# Patient Record
Sex: Female | Born: 1970 | Race: White | Hispanic: No | Marital: Married | Smoking: Never smoker
Health system: Southern US, Community
[De-identification: ages and names within clinical notes are randomized; demographics above are authoritative.]

## PROBLEM LIST (undated history)

## (undated) DIAGNOSIS — G8929 Other chronic pain: Secondary | ICD-10-CM

## (undated) DIAGNOSIS — Z1211 Encounter for screening for malignant neoplasm of colon: Secondary | ICD-10-CM

## (undated) DIAGNOSIS — M47816 Spondylosis without myelopathy or radiculopathy, lumbar region: Secondary | ICD-10-CM

## (undated) DIAGNOSIS — M5416 Radiculopathy, lumbar region: Secondary | ICD-10-CM

## (undated) DIAGNOSIS — G43909 Migraine, unspecified, not intractable, without status migrainosus: Secondary | ICD-10-CM

## (undated) DIAGNOSIS — K219 Gastro-esophageal reflux disease without esophagitis: Secondary | ICD-10-CM

## (undated) DIAGNOSIS — J302 Other seasonal allergic rhinitis: Secondary | ICD-10-CM

## (undated) DIAGNOSIS — G4700 Insomnia, unspecified: Secondary | ICD-10-CM

## (undated) DIAGNOSIS — D219 Benign neoplasm of connective and other soft tissue, unspecified: Secondary | ICD-10-CM

## (undated) DIAGNOSIS — M47812 Spondylosis without myelopathy or radiculopathy, cervical region: Secondary | ICD-10-CM

## (undated) DIAGNOSIS — R51 Headache: Secondary | ICD-10-CM

## (undated) DIAGNOSIS — M51369 Other intervertebral disc degeneration, lumbar region without mention of lumbar back pain or lower extremity pain: Secondary | ICD-10-CM

## (undated) DIAGNOSIS — E785 Hyperlipidemia, unspecified: Secondary | ICD-10-CM

## (undated) DIAGNOSIS — J45909 Unspecified asthma, uncomplicated: Secondary | ICD-10-CM

## (undated) DIAGNOSIS — M25551 Pain in right hip: Secondary | ICD-10-CM

## (undated) DIAGNOSIS — G43019 Migraine without aura, intractable, without status migrainosus: Principal | ICD-10-CM

## (undated) HISTORY — DX: Benign neoplasm of connective and other soft tissue, unspecified: D21.9

## (undated) HISTORY — DX: Headache: R51

## (undated) HISTORY — DX: Encounter for screening for malignant neoplasm of colon: Z12.11

## (undated) HISTORY — PX: TONSILLECTOMY: SUR1361

## (undated) HISTORY — DX: Insomnia, unspecified: G47.00

## (undated) HISTORY — DX: Migraine, unspecified, not intractable, without status migrainosus: G43.909

## (undated) HISTORY — DX: Hyperlipidemia, unspecified: E78.5

## (undated) HISTORY — PX: WISDOM TOOTH EXTRACTION: SHX21

## (undated) HISTORY — DX: Migraine without aura, intractable, without status migrainosus: G43.019

## (undated) HISTORY — DX: Other chronic pain: G89.29

## (undated) HISTORY — PX: MYOMECTOMY: SHX85

## (undated) HISTORY — DX: Spondylosis without myelopathy or radiculopathy, lumbar region: M47.816

## (undated) HISTORY — PX: KNEE ARTHROSCOPY: SHX127

## (undated) HISTORY — DX: Other intervertebral disc degeneration, lumbar region without mention of lumbar back pain or lower extremity pain: M51.369

## (undated) HISTORY — PX: DILATION AND CURETTAGE OF UTERUS: SHX78

## (undated) HISTORY — DX: Pain in right hip: M25.551

## (undated) HISTORY — DX: Spondylosis without myelopathy or radiculopathy, cervical region: M47.812

## (undated) HISTORY — DX: Radiculopathy, lumbar region: M54.16

---

## 1999-11-09 ENCOUNTER — Encounter: Payer: Self-pay | Admitting: Family Medicine

## 1999-11-09 ENCOUNTER — Encounter: Admission: RE | Admit: 1999-11-09 | Discharge: 1999-11-09 | Payer: Self-pay | Admitting: Family Medicine

## 2005-12-23 ENCOUNTER — Other Ambulatory Visit: Admission: RE | Admit: 2005-12-23 | Discharge: 2005-12-23 | Payer: Self-pay | Admitting: Obstetrics and Gynecology

## 2006-04-21 ENCOUNTER — Inpatient Hospital Stay (HOSPITAL_COMMUNITY): Admission: AD | Admit: 2006-04-21 | Discharge: 2006-04-21 | Payer: Self-pay | Admitting: Obstetrics and Gynecology

## 2006-05-22 ENCOUNTER — Inpatient Hospital Stay (HOSPITAL_COMMUNITY): Admission: AD | Admit: 2006-05-22 | Discharge: 2006-05-22 | Payer: Self-pay | Admitting: Obstetrics and Gynecology

## 2006-06-24 ENCOUNTER — Inpatient Hospital Stay (HOSPITAL_COMMUNITY): Admission: AD | Admit: 2006-06-24 | Discharge: 2006-06-27 | Payer: Self-pay | Admitting: Obstetrics and Gynecology

## 2008-12-23 ENCOUNTER — Encounter: Admission: RE | Admit: 2008-12-23 | Discharge: 2008-12-23 | Payer: Self-pay | Admitting: Obstetrics and Gynecology

## 2011-03-01 NOTE — Discharge Summary (Signed)
[name], Ana [name]               ACCOUNT NO.:  [id number]   MEDICAL RECORD NO.:  [id number]          PATIENT TYPE:  INP   LOCATION:  9117                          FACILITY:  [facility]   PHYSICIAN:  Guy Sandifer. Henderson Cloud, M.D. DATE OF BIRTH:  11/10/1970   DATE OF ADMISSION:  06/24/2006  DATE OF DISCHARGE:  06/27/2006                                 DISCHARGE SUMMARY   ADMISSION DIAGNOSES:  1. Intrauterine pregnancy at 38-1/2 weeks estimated gestational age.  2. Previous myomectomy and previous cesarean delivery.   DISCHARGE DIAGNOSES:  1. Status post low transverse cesarean section.  2. Viable female infant.   PROCEDURES:  Repeat low transverse cesarean section.  Please see dictated  H&P.   HISTORY AND PHYSICAL:  The patient is a 40 year old gravida 5, para 1 that  was admitted to [facility] at 38-1/2 weeks estimated  gestational age for scheduled cesarean delivery.  The patient's pregnancy  had been complicated by preterm labor, previous myomectomy in 2004.   On the morning of admission, the patient was taken to the operating room  where spinal anesthesia was administered without difficulty. Low transverse  incision was made with delivery of a viable female infant weighing 7 pounds  8 ounces, Apgars of 9 at 1 minute, 9 at 5 minutes.  Arterial cord pH was  7.28.  The patient tolerated the procedure well and was taken to the  recovery room in stable condition.   On postoperative day #1, the patient did complain of some sharp burning pain  at the right margin of the incision.  Vital signs were stable; she was  afebrile.  Abdomen soft with good return of bowel function.  Extremities  nontender.  Her abdominal dressing was noted to be clean, dry, and intact  which was partially removed without evidence of ecchymosis or bleeding.  Staples appeared intact.  Laboratory values revealed a hemoglobin 8.6,  platelet count 181,000, WBC 6.6.  The patient was started on some iron  supplementation.   On postoperative day #2, the patient stated pain was improved, however, not  completely resolved at the right margin of the incisional site.  Vital signs  were stable.  She was afebrile.  Abdomen soft with good return of bowel  function. Fundus firm and nontender.  Abdominal dressing had been removed  revealing incision that was clean, dry, and intact.  The patient ambulating  well.   On postoperative day #3, the patient was without complaint.  Vital signs  were stable.  She was afebrile.  Abdomen soft.  Fundus warm and nontender.  Incision was clean, dry, and intact.   DISCHARGE INSTRUCTIONS:  The patient was ready for discharge home.   CONDITION ON DISCHARGE:  Good.   DIET:  Regular as tolerated.   ACTIVITY:  No heavy lifting, no driving x2 weeks, no vaginal entry.   FOLLOW UP:  The patient is to follow up in the office in 2 weeks for  incision check.  She is to call for temperature greater than 100 degrees,  persistent nausea and vomiting, heavy vaginal bleeding and or redness or  drainage at incisional site.   DISCHARGE MEDICATIONS:  1. Percocet 5/325, #30, one p.o. every 4-6 hours p.r.n.  2. Motrin 600 mg every 6 hours.  3. Prenatal vitamins 1 p.o. daily  4. __________ p.o. daily p.r.n.      Julio Sicks, N.P.      Guy Sandifer. Henderson Cloud, M.D.  Electronically Signed    CC/MEDQ  D:  07/28/2006  T:  07/28/2006  Job:  [id number]

## 2011-03-01 NOTE — Op Note (Signed)
Ana [name], Ana [name]               ACCOUNT NO.:  [id number]   MEDICAL RECORD NO.:  [id number]          PATIENT TYPE:  INP   LOCATION:  9117                          [facility]ILITY[facility]  [facility]   PHYSICIAN:  Dineen Kid. Rana Snare, M.D.    DATE OF BIRTH:  June 09, 1971   DATE OF PROCEDURE:  06/24/2006  DATE OF DISCHARGE:                                 OPERATIVE REPORT   PREOPERATIVE DIAGNOSIS:  Previous myomectomy and previous cesarean section  and intrauterine pregnancy of 83 1/[redacted] weeks gestational age.   POSTOPERATIVE DIAGNOSIS:  Previous myomectomy and previous cesarean section  and intrauterine pregnancy of 87 1/[redacted] weeks gestational age.   PROCEDURE:  Repeat low segment transverse cesarean section.   SURGEON:  Dineen Kid. Rana Snare, M.D.   ANESTHESIA:  Spinal.   INDICATIONS FOR PROCEDURE:  [name] is a 40 year old G5, P1, A3, at 48  1/[redacted] weeks gestational age.  This pregnancy has been complicated by preterm  labor and previous myomectomy and two vessel cord.  She presents today for  repeat cesarean section.  The risks and  benefits of the procedure were  discussed at length and informed consent was obtained.   OPERATIVE FINDINGS:  A viable female infant, Apgars 9 and 9, pH arterial  7.28, the weight was 7 pounds 8 ounces.   DESCRIPTION OF PROCEDURE:  After adequate analgesia, the patient was placed  in the supine position left lateral tilt. She was sterilely prepped and  draped.  The bladder was drained with a Foley catheter.  The previous  incision was sharply excised and a Pfannenstiel skin incision was taken down  sharply at the midline where the fascia was incised laterally and extended  superiorly and inferiorly to the bellies of the rectus muscle which were  separated sharply in the midline.  The peritoneum was entered sharply, the  bladder flap was created and placed behind a bladder blade.  A low segment  incision was made down to the infant's vertex, extended laterally with the  operators  fingertips.  The vertex was delivered with a vacuum extractor with  one easy pull.  There nasopharynx and oropharynx were then suctioned.  The  shoulder was easily delivered.  The infant was then delivered, the cord was  clamped and cut, and the infant was handed to the pediatrician for  resuscitation.  Cord blood was obtained.  The placenta was extracted  manually.  The uterus was exteriorized, wiped clean with a dry lap.  The  incision was closed in two layers, the first with #1 Vicryl, the second  being an imbricated layer of 0 Monocryl suture.  The uterus was placed back  into the peritoneal cavity.  After a copious amount of irrigation, adequate  hemostasis was assured.  The peritoneum was closed with 0 Monocryl, the  rectus muscles were plicated in the midline, irrigation was applied and  after adequate hemostasis, the fascia was closed with two sutures of #1  Vicryl in a running fashion.  Irrigation was  applied and after adequate hemostasis, the skin was stapled and Steri-Strips  were applied.  The patient  tolerated the procedure well, was stable on  transfer to the recovery room.  Sponge and instrument counts were correct x  3.  Estimated blood loss 600 mL.  The patient received 1 gram Rocephin after  delivery of the placenta.      Dineen Kid Rana Snare, M.D.  Electronically Signed     DCL/MEDQ  D:  06/24/2006  T:  06/24/2006  Job:  [id number]

## 2011-03-01 NOTE — H&P (Signed)
Ana [name], Ana [name]               ACCOUNT NO.:  [id number]   MEDICAL RECORD NO.:  [id number]          PATIENT TYPE:  INP   LOCATION:  NA                            [facility]   PHYSICIAN:  Dineen Kid. Rana Snare, M.D.    DATE OF BIRTH:  1971/07/22   DATE OF ADMISSION:  DATE OF DISCHARGE:                                HISTORY & PHYSICAL   HISTORY OF PRESENT ILLNESS:  Ana [name] is a 40 year old, G5, P1, A3, at 65-  1/[redacted] weeks gestational age, who presents for repeat cesarean section.  Her  pregnancy has been complicated by a fetus with a two vessel cord, and  prodromal labor, previous cesarean section with history of a myomectomy.  She desires repeat cesarean section, and presents for this.  Also noted on  ultrasound is a succenturiate lobe of the placenta.  She also has a history  of preterm labor.   PAST MEDICAL HISTORY:  Significant for history of asthma.   PAST SURGICAL HISTORY:  1. History of myomectomy in 2004.  2. Cesarean section in 2005.  3. She has had three D&Es.  4. Tonsillectomy.  5. Knee surgery.  6. Wisdom teeth extraction.   MEDICATIONS:  Prenatal vitamin, albuterol, Zofran and Phenergan.  SHE HAS NO  KNOWN DRUG ALLERGIES.   PHYSICAL EXAMINATION:  Blood pressure is 112/62.  HEART:  Regular rate and rhythm.  LUNGS:  Clear to auscultation bilaterally.  Fetal heart rate is 140s.  ABDOMEN:  Gravid, nontender.  Fundal height is 37 cm.  Cervix is closed, thick and high.   IMPRESSION AND PLAN:  1. Intrauterine pregnancy at 38-1/[redacted] weeks gestational age.  2. Previous cesarean section  3. Myomectomy.  4. Prodromal labor.  5. Two vessel cord.   Patient desires repeat cesarean section.  Risks and benefits of the  procedure were discussed at length, which include, but are not limited to,  risk of infection, bleeding, damage to the uterus, tubes, ovaries, bowel,  bladder, fetus, risks associated with anesthesia, and blood transfusion.  She gives her informed consent and  wishes to proceed.      Dineen Kid Rana Snare, M.D.  Electronically Signed     DCL/MEDQ  D:  06/23/2006  T:  06/23/2006  Job:  [id number]

## 2012-08-18 ENCOUNTER — Encounter (HOSPITAL_COMMUNITY): Payer: Self-pay | Admitting: Pharmacist

## 2012-08-20 ENCOUNTER — Encounter (HOSPITAL_COMMUNITY): Payer: Self-pay

## 2012-08-20 ENCOUNTER — Encounter (HOSPITAL_COMMUNITY)
Admission: RE | Admit: 2012-08-20 | Discharge: 2012-08-20 | Disposition: A | Discharge status: Home / Self Care | Payer: Managed Care, Other (non HMO) | Source: Ambulatory Visit | Attending: Obstetrics and Gynecology | Admitting: Obstetrics and Gynecology

## 2012-08-20 HISTORY — DX: Gastro-esophageal reflux disease without esophagitis: K21.9

## 2012-08-20 HISTORY — DX: Other seasonal allergic rhinitis: J30.2

## 2012-08-20 HISTORY — DX: Unspecified asthma, uncomplicated: J45.909

## 2012-08-20 LAB — SURGICAL PCR SCREEN
MRSA, PCR: NEGATIVE
Staphylococcus aureus: NEGATIVE

## 2012-08-20 LAB — CBC
HCT: 40.7 % (ref 36.0–46.0)
Hemoglobin: 14 g/dL (ref 12.0–15.0)
MCH: 29 pg (ref 26.0–34.0)
MCHC: 34.4 g/dL (ref 30.0–36.0)
MCV: 84.3 fL (ref 78.0–100.0)
Platelets: 238 10*3/uL (ref 150–400)
RBC: 4.83 MIL/uL (ref 3.87–5.11)
RDW: 12.6 % (ref 11.5–15.5)
WBC: 7.8 10*3/uL (ref 4.0–10.5)

## 2012-08-20 NOTE — Patient Instructions (Addendum)
   Your procedure is scheduled on: Monday November 18th  Enter through the Main Entrance of [facility] at:6am Pick up the phone at the desk and dial [phone] and inform us of your arrival.  Please call this number if you have any problems the morning of surgery: [phone]  Remember: Do not eat or drink after midnight on Sunday   Do not wear jewelry, make-up, or FINGER nail polish No metal in your hair or on your body. Do not wear lotions, powders, perfumes. You may wear deodorant.  Please use your CHG wash as directed prior to surgery.  Do not shave anywhere for at least 12 hours prior to first CHG shower.  Do not bring valuables to the hospital.   Leave suitcase in the car. After Surgery it may be brought to your room. For patients being admitted to the hospital, checkout time is 11:00am the day of discharge.

## 2012-08-25 ENCOUNTER — Other Ambulatory Visit (HOSPITAL_COMMUNITY): Payer: Self-pay

## 2012-08-25 NOTE — H&P (Addendum)
  S: Ana [name] presents today for preop evaluation for hysterectomy.  Rafael has been having worsening problems with pelvic pain, dyspareunia, menorrhagia and does have a 10-week size fibroid uterus.  She has had a previous myomectomy years ago in [place].  She subsequently had two Cesarean sections to follow.  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  She desires definitive surgical intervention.  Currently she is on birth control pills and still continues to have dysmenorrhea and menorrhagia on the pill.  Her husband has had a vasectomy.  She has no further childbearing desires.  Her past medical history is significant for asthma and a history of migraines.  Past surgical history:  She had a myomectomy in [place] in 2004.  Two Cesarean sections.  Three D&Cs.  Medications:  She is currently on Flonase, birth control pills, Zyrtec.  She has seasonal allergies but no medicine allergies. O: Physical exam:  Blood pressure is 108/62.  Heart:  Regular rate and rhythm.  Lungs clear to auscultation bilaterally.  Abdomen is nondistended, nontender.  There is no rebound or guarding.  No flank pain.  Normal external genitalia, Bartholin's, Skene's, urethra.  Uterus is 8-10 week size, anteverted, irregular.  There is no lymphadenopathy noted. A&P: Fibroids, menorrhagia, pelvic pain, dyspareunia, 10-week size fibroids.  After discussing multiple options with the patient, she desires to proceed with hysterectomy.  Plan total abdominal hysterectomy with preservation of the ovaries.  She does give me permission to remove one or both if necessary.  Discussed the procedure at length, risks and benefits including but not limited to risk of infection, bleeding, damage to bowel, bladder, ureters, ovaries, risks associated with bowel injury, risks associated with blood transfusion.  We discussed routine postoperative care.  All of her questions were answered and she has  given her informed consent. Dineen Kid Rana Snare, MD/rad  This patient has been seen and examined.   All of her questions were answered.  Labs and vital signs reviewed.  Informed consent has been obtained.  The History and Physical is current.  08/31/12  0715 DL

## 2012-08-30 MED ORDER — DEXTROSE 5 % IV SOLN
2.0000 g | INTRAVENOUS | Status: AC
  Administered 2012-08-31: 2 g via INTRAVENOUS
  Filled 2012-08-30: qty 2

## 2012-08-31 ENCOUNTER — Inpatient Hospital Stay (HOSPITAL_COMMUNITY)
Admission: RE | Admit: 2012-08-31 | Discharge: 2012-09-02 | DRG: 743 | Disposition: A | Discharge status: Home / Self Care | Payer: Managed Care, Other (non HMO) | Source: Ambulatory Visit | Attending: Obstetrics and Gynecology | Admitting: Obstetrics and Gynecology

## 2012-08-31 ENCOUNTER — Encounter (HOSPITAL_COMMUNITY): Payer: Self-pay | Admitting: Anesthesiology

## 2012-08-31 ENCOUNTER — Ambulatory Visit (HOSPITAL_COMMUNITY): Payer: Managed Care, Other (non HMO) | Admitting: Anesthesiology

## 2012-08-31 ENCOUNTER — Encounter (HOSPITAL_COMMUNITY): Payer: Self-pay

## 2012-08-31 ENCOUNTER — Encounter (HOSPITAL_COMMUNITY)
Admission: RE | Disposition: A | Discharge status: Home / Self Care | Payer: Self-pay | Source: Ambulatory Visit | Attending: Obstetrics and Gynecology

## 2012-08-31 DIAGNOSIS — D252 Subserosal leiomyoma of uterus: Secondary | ICD-10-CM | POA: Diagnosis present

## 2012-08-31 DIAGNOSIS — IMO0002 Reserved for concepts with insufficient information to code with codable children: Principal | ICD-10-CM | POA: Diagnosis present

## 2012-08-31 DIAGNOSIS — Z90710 Acquired absence of both cervix and uterus: Secondary | ICD-10-CM

## 2012-08-31 DIAGNOSIS — D250 Submucous leiomyoma of uterus: Secondary | ICD-10-CM | POA: Diagnosis present

## 2012-08-31 DIAGNOSIS — D251 Intramural leiomyoma of uterus: Secondary | ICD-10-CM | POA: Diagnosis present

## 2012-08-31 DIAGNOSIS — N920 Excessive and frequent menstruation with regular cycle: Secondary | ICD-10-CM | POA: Diagnosis present

## 2012-08-31 DIAGNOSIS — N949 Unspecified condition associated with female genital organs and menstrual cycle: Secondary | ICD-10-CM | POA: Diagnosis present

## 2012-08-31 HISTORY — PX: ABDOMINAL HYSTERECTOMY: SHX81

## 2012-08-31 SURGERY — HYSTERECTOMY, ABDOMINAL
Anesthesia: General | Site: Abdomen | Wound class: Clean Contaminated

## 2012-08-31 MED ORDER — NEOSTIGMINE METHYLSULFATE 1 MG/ML IJ SOLN
INTRAMUSCULAR | Status: DC | PRN
  Administered 2012-08-31: 3 mg via INTRAVENOUS

## 2012-08-31 MED ORDER — HYDROMORPHONE HCL PF 1 MG/ML IJ SOLN
INTRAMUSCULAR | Status: DC | PRN
  Administered 2012-08-31: 1 mg via INTRAVENOUS

## 2012-08-31 MED ORDER — ONDANSETRON HCL 4 MG/2ML IJ SOLN
INTRAMUSCULAR | Status: DC | PRN
  Administered 2012-08-31: 4 mg via INTRAVENOUS

## 2012-08-31 MED ORDER — SCOPOLAMINE 1 MG/3DAYS TD PT72
MEDICATED_PATCH | TRANSDERMAL | Status: AC
  Filled 2012-08-31: qty 1

## 2012-08-31 MED ORDER — HYDROMORPHONE HCL PF 1 MG/ML IJ SOLN
0.2500 mg | INTRAMUSCULAR | Status: DC | PRN
  Administered 2012-08-31 (×2): 0.5 mg via INTRAVENOUS
  Administered 2012-08-31: 1 mg via INTRAVENOUS
  Administered 2012-08-31 (×2): 0.5 mg via INTRAVENOUS

## 2012-08-31 MED ORDER — HYDROMORPHONE HCL PF 1 MG/ML IJ SOLN
INTRAMUSCULAR | Status: AC
  Administered 2012-08-31: 0.5 mg via INTRAVENOUS
  Filled 2012-08-31: qty 1

## 2012-08-31 MED ORDER — FENTANYL CITRATE 0.05 MG/ML IJ SOLN
INTRAMUSCULAR | Status: DC | PRN
  Administered 2012-08-31 (×2): 50 ug via INTRAVENOUS
  Administered 2012-08-31 (×2): 100 ug via INTRAVENOUS
  Administered 2012-08-31 (×2): 50 ug via INTRAVENOUS
  Administered 2012-08-31: 100 ug via INTRAVENOUS

## 2012-08-31 MED ORDER — PROPOFOL 10 MG/ML IV EMUL
INTRAVENOUS | Status: AC
  Filled 2012-08-31: qty 20

## 2012-08-31 MED ORDER — ONDANSETRON HCL 4 MG/2ML IJ SOLN: 4.0000 mg | Freq: Four times a day (QID) | INTRAMUSCULAR | Status: DC | PRN

## 2012-08-31 MED ORDER — GLYCOPYRROLATE 0.2 MG/ML IJ SOLN
INTRAMUSCULAR | Status: DC | PRN
  Administered 2012-08-31: 0.6 mg via INTRAVENOUS

## 2012-08-31 MED ORDER — DIPHENHYDRAMINE HCL 50 MG/ML IJ SOLN: 12.5000 mg | Freq: Four times a day (QID) | INTRAMUSCULAR | Status: DC | PRN

## 2012-08-31 MED ORDER — HYDROMORPHONE HCL PF 1 MG/ML IJ SOLN
0.2000 mg | INTRAMUSCULAR | Status: DC | PRN
  Administered 2012-09-01: 0.5 mg via INTRAVENOUS
  Filled 2012-08-31: qty 1

## 2012-08-31 MED ORDER — GLYCOPYRROLATE 0.2 MG/ML IJ SOLN
INTRAMUSCULAR | Status: AC
  Filled 2012-08-31: qty 1

## 2012-08-31 MED ORDER — ROCURONIUM BROMIDE 50 MG/5ML IV SOLN
INTRAVENOUS | Status: AC
  Filled 2012-08-31: qty 1

## 2012-08-31 MED ORDER — HYDROMORPHONE 0.3 MG/ML IV SOLN
INTRAVENOUS | Status: DC
  Administered 2012-08-31: 3.19 mg via INTRAVENOUS
  Administered 2012-08-31: 1.2 mg via INTRAVENOUS
  Administered 2012-08-31: 11:00:00 via INTRAVENOUS
  Administered 2012-09-01: 1.39 mg via INTRAVENOUS
  Filled 2012-08-31 (×2): qty 25

## 2012-08-31 MED ORDER — MEPERIDINE HCL 25 MG/ML IJ SOLN
6.2500 mg | INTRAMUSCULAR | Status: DC | PRN
  Administered 2012-08-31: 6.25 mg via INTRAVENOUS

## 2012-08-31 MED ORDER — NALOXONE HCL 0.4 MG/ML IJ SOLN: 0.4000 mg | INTRAMUSCULAR | Status: DC | PRN

## 2012-08-31 MED ORDER — SCOPOLAMINE 1 MG/3DAYS TD PT72
1.0000 | MEDICATED_PATCH | TRANSDERMAL | Status: DC
Start: 2012-08-31 — End: 2012-08-31

## 2012-08-31 MED ORDER — HYDROMORPHONE HCL PF 1 MG/ML IJ SOLN
INTRAMUSCULAR | Status: AC
  Filled 2012-08-31: qty 1

## 2012-08-31 MED ORDER — NEOSTIGMINE METHYLSULFATE 1 MG/ML IJ SOLN
INTRAMUSCULAR | Status: AC
  Filled 2012-08-31: qty 10

## 2012-08-31 MED ORDER — MIDAZOLAM HCL 2 MG/2ML IJ SOLN
INTRAMUSCULAR | Status: AC
  Filled 2012-08-31: qty 2

## 2012-08-31 MED ORDER — METOCLOPRAMIDE HCL 5 MG/ML IJ SOLN: 10.0000 mg | Freq: Once | INTRAMUSCULAR | Status: DC | PRN

## 2012-08-31 MED ORDER — PROPOFOL 10 MG/ML IV EMUL
INTRAVENOUS | Status: DC | PRN
  Administered 2012-08-31: 150 mg via INTRAVENOUS

## 2012-08-31 MED ORDER — OXYCODONE-ACETAMINOPHEN 5-325 MG PO TABS
1.0000 | ORAL_TABLET | ORAL | Status: DC | PRN
  Administered 2012-09-01 – 2012-09-02 (×8): 2 via ORAL
  Filled 2012-08-31 (×8): qty 2

## 2012-08-31 MED ORDER — ONDANSETRON HCL 4 MG/2ML IJ SOLN
INTRAMUSCULAR | Status: AC
Start: 2012-08-31 — End: 2012-08-31
  Filled 2012-08-31: qty 2

## 2012-08-31 MED ORDER — HYDROMORPHONE HCL PF 1 MG/ML IJ SOLN
INTRAMUSCULAR | Status: AC
  Administered 2012-08-31: 1 mg via INTRAVENOUS
  Filled 2012-08-31: qty 1

## 2012-08-31 MED ORDER — DEXTROSE-NACL 5-0.45 % IV SOLN
INTRAVENOUS | Status: DC
  Administered 2012-08-31 – 2012-09-01 (×3): via INTRAVENOUS

## 2012-08-31 MED ORDER — DEXAMETHASONE SODIUM PHOSPHATE 4 MG/ML IJ SOLN
INTRAMUSCULAR | Status: DC | PRN
  Administered 2012-08-31: 10 mg via INTRAVENOUS

## 2012-08-31 MED ORDER — SODIUM CHLORIDE 0.9 % IJ SOLN: 9.0000 mL | INTRAMUSCULAR | Status: DC | PRN

## 2012-08-31 MED ORDER — ROCURONIUM BROMIDE 100 MG/10ML IV SOLN
INTRAVENOUS | Status: DC | PRN
  Administered 2012-08-31: 35 mg via INTRAVENOUS
  Administered 2012-08-31: 5 mg via INTRAVENOUS

## 2012-08-31 MED ORDER — GLYCOPYRROLATE 0.2 MG/ML IJ SOLN
INTRAMUSCULAR | Status: AC
  Filled 2012-08-31: qty 2

## 2012-08-31 MED ORDER — LACTATED RINGERS IV SOLN
INTRAVENOUS | Status: DC
  Administered 2012-08-31 (×2): via INTRAVENOUS
  Administered 2012-08-31: 50 mL/h via INTRAVENOUS

## 2012-08-31 MED ORDER — DEXAMETHASONE SODIUM PHOSPHATE 10 MG/ML IJ SOLN
INTRAMUSCULAR | Status: AC
  Filled 2012-08-31: qty 1

## 2012-08-31 MED ORDER — IBUPROFEN 600 MG PO TABS
600.0000 mg | ORAL_TABLET | Freq: Four times a day (QID) | ORAL | Status: DC | PRN
  Administered 2012-09-01 – 2012-09-02 (×6): 600 mg via ORAL
  Filled 2012-08-31 (×6): qty 1

## 2012-08-31 MED ORDER — MEPERIDINE HCL 25 MG/ML IJ SOLN
INTRAMUSCULAR | Status: AC
  Filled 2012-08-31: qty 1

## 2012-08-31 MED ORDER — FLUTICASONE PROPIONATE 50 MCG/ACT NA SUSP
1.0000 | Freq: Every day | NASAL | Status: DC
  Administered 2012-09-01 – 2012-09-02 (×2): 1 via NASAL
  Filled 2012-08-31: qty 16

## 2012-08-31 MED ORDER — ZOLPIDEM TARTRATE 5 MG PO TABS
5.0000 mg | ORAL_TABLET | Freq: Every evening | ORAL | Status: DC | PRN
  Administered 2012-09-01: 5 mg via ORAL
  Filled 2012-08-31: qty 1

## 2012-08-31 MED ORDER — LIDOCAINE HCL (CARDIAC) 20 MG/ML IV SOLN
INTRAVENOUS | Status: DC | PRN
  Administered 2012-08-31: 60 mg via INTRAVENOUS

## 2012-08-31 MED ORDER — LIDOCAINE HCL (CARDIAC) 20 MG/ML IV SOLN
INTRAVENOUS | Status: AC
  Filled 2012-08-31: qty 5

## 2012-08-31 MED ORDER — MENTHOL 3 MG MT LOZG: 1.0000 | LOZENGE | OROMUCOSAL | Status: DC | PRN

## 2012-08-31 MED ORDER — MIDAZOLAM HCL 5 MG/5ML IJ SOLN
INTRAMUSCULAR | Status: DC | PRN
  Administered 2012-08-31: 2 mg via INTRAVENOUS

## 2012-08-31 MED ORDER — FENTANYL CITRATE 0.05 MG/ML IJ SOLN
INTRAMUSCULAR | Status: AC
  Filled 2012-08-31: qty 5

## 2012-08-31 MED ORDER — DIPHENHYDRAMINE HCL 12.5 MG/5ML PO ELIX: 12.5000 mg | ORAL_SOLUTION | Freq: Four times a day (QID) | ORAL | Status: DC | PRN

## 2012-08-31 SURGICAL SUPPLY — 39 items
CANISTER SUCTION 2500CC (MISCELLANEOUS) ×2 IMPLANT
CELLS DAT CNTRL 66122 CELL SVR (MISCELLANEOUS) IMPLANT
CLOTH BEACON ORANGE TIMEOUT ST (SAFETY) ×2 IMPLANT
DECANTER SPIKE VIAL GLASS SM (MISCELLANEOUS) IMPLANT
DRSG COVADERM 4X10 (GAUZE/BANDAGES/DRESSINGS) ×2 IMPLANT
ELECT LIGASURE LONG (ELECTRODE) ×2 IMPLANT
GLOVE BIO SURGEON STRL SZ 6.5 (GLOVE) ×6 IMPLANT
GLOVE BIO SURGEON STRL SZ7 (GLOVE) ×2 IMPLANT
GLOVE BIO SURGEON STRL SZ8 (GLOVE) ×2 IMPLANT
GLOVE SURG ORTHO 8.0 STRL STRW (GLOVE) ×2 IMPLANT
GOWN PREVENTION PLUS LG XLONG (DISPOSABLE) ×4 IMPLANT
GOWN STRL REIN XL XLG (GOWN DISPOSABLE) ×2 IMPLANT
NEEDLE HYPO 25X1 1.5 SAFETY (NEEDLE) IMPLANT
NS IRRIG 1000ML POUR BTL (IV SOLUTION) ×2 IMPLANT
PACK ABDOMINAL GYN (CUSTOM PROCEDURE TRAY) ×2 IMPLANT
PAD OB MATERNITY 4.3X12.25 (PERSONAL CARE ITEMS) ×2 IMPLANT
PROTECTOR NERVE ULNAR (MISCELLANEOUS) ×2 IMPLANT
RETRACTOR WND ALEXIS 25 LRG (MISCELLANEOUS) IMPLANT
RTRCTR WOUND ALEXIS 18CM MED (MISCELLANEOUS)
RTRCTR WOUND ALEXIS 25CM LRG (MISCELLANEOUS)
SPONGE LAP 18X18 X RAY DECT (DISPOSABLE) ×4 IMPLANT
STAPLER VISISTAT 35W (STAPLE) ×2 IMPLANT
STRIP CLOSURE SKIN 1/4X4 (GAUZE/BANDAGES/DRESSINGS) IMPLANT
SUT CHROMIC 2 0 SH (SUTURE) ×2 IMPLANT
SUT CHROMIC 3 0 SH 27 (SUTURE) IMPLANT
SUT MNCRL 0 MO-4 VIOLET 18 CR (SUTURE) ×1 IMPLANT
SUT MNCRL 0 VIOLET 6X18 (SUTURE) ×1 IMPLANT
SUT MONOCRYL 0 6X18 (SUTURE) ×1
SUT MONOCRYL 0 MO 4 18  CR/8 (SUTURE) ×1
SUT PDS AB 0 CT1 27 (SUTURE) IMPLANT
SUT VIC AB 0 CT1 18XCR BRD8 (SUTURE) ×1 IMPLANT
SUT VIC AB 0 CT1 8-18 (SUTURE) ×2
SUT VIC AB 1 CTX 36 (SUTURE) ×2
SUT VIC AB 1 CTX36XBRD ANBCTRL (SUTURE) ×1 IMPLANT
SUT VIC AB 2-0 CT1 (SUTURE) ×2 IMPLANT
SYR CONTROL 10ML LL (SYRINGE) IMPLANT
TOWEL OR 17X24 6PK STRL BLUE (TOWEL DISPOSABLE) ×4 IMPLANT
TRAY FOLEY CATH 14FR (SET/KITS/TRAYS/PACK) ×2 IMPLANT
WATER STERILE IRR 1000ML POUR (IV SOLUTION) ×2 IMPLANT

## 2012-08-31 NOTE — Anesthesia Preprocedure Evaluation (Signed)
Anesthesia Evaluation  Patient identified by MRN, date of birth, ID band Patient awake    Reviewed: Allergy & Precautions, H&P , NPO status , Patient's Chart, lab work & pertinent test results  Airway Mallampati: I TM Distance: >3 FB Neck ROM: Full    Dental No notable dental hx. (+) Teeth Intact   Pulmonary asthma ,  breath sounds clear to auscultation  Pulmonary exam normal       Cardiovascular negative cardio ROS  Rhythm:Regular Rate:Normal     Neuro/Psych  Headaches, negative psych ROS   GI/Hepatic Neg liver ROS, GERD-  Medicated,  Endo/Other  negative endocrine ROS  Renal/GU negative Renal ROS  negative genitourinary   Musculoskeletal negative musculoskeletal ROS (+)   Abdominal Normal abdominal exam  (+)   Peds negative pediatric ROS (+)  Hematology negative hematology ROS (+)   Anesthesia Other Findings   Reproductive/Obstetrics negative OB ROS Fibroid Uterus Menorrhagia Dyspareunia                           Anesthesia Physical Anesthesia Plan  ASA: II  Anesthesia Plan: General   Post-op Pain Management:    Induction: Intravenous  Airway Management Planned: Oral ETT  Additional Equipment:   Intra-op Plan:   Post-operative Plan: Extubation in OR  Informed Consent: I have reviewed the patients History and Physical, chart, labs and discussed the procedure including the risks, benefits and alternatives for the proposed anesthesia with the patient or authorized representative who has indicated his/her understanding and acceptance.   Dental advisory given  Plan Discussed with: CRNA, Anesthesiologist and Surgeon  Anesthesia Plan Comments:         Anesthesia Quick Evaluation

## 2012-08-31 NOTE — Op Note (Signed)
NAMELAPARIS, WALLOCH               ACCOUNT NO.:  [id number]  MEDICAL RECORD NO.:  [id number]  LOCATION:  9302                          [facility]ACILITY:  [facility]  PHYSICIAN:  Dineen Kid. Rana Snare, M.D.    DATE OF BIRTH:  1970-12-28  DATE OF PROCEDURE:  08/31/2012 DATE OF DISCHARGE:                              OPERATIVE REPORT   PREOPERATIVE DIAGNOSES:  Fibroids menorrhagia, pelvic pain, dyspareunia, 10-week size fibroids.  POSTOPERATIVE DIAGNOSES:  Fibroids menorrhagia, pelvic pain, dyspareunia, 10-week size fibroids.  PROCEDURE:  Total abdominal hysterectomy.  SURGEON:  Dineen Kid. Rana Snare, M.D.  ASSISTANT:  Stann Mainland. Vincente Poli, M.D.  ANESTHESIA:  General endotracheal.  INDICATIONS:  Ms. Dethloff is a 41 year old with worsening problems with menorrhagia, pelvic pain and dyspareunia.  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  She desires definitive surgical intervention.  Currently on birth control pills, but continues to have pain and bleeding problems with birth control pills.  Has had a vasectomy, so there have no further childbearing desires.  She presents for definitive surgery and requests hysterectomy.  The risks and benefits of the procedure were discussed at length, and informed consent was obtained.  By the time of surgery, normal-appearing ovaries, uterus, multiple fibroids ten-week size and pelvic adhesions.  DESCRIPTION OF PROCEDURE:  After adequate analgesia, the patient was placed in the supine position.  She was sterilely prepped and draped and bladder was sterilely drained with Foley catheter.  Previous Pfannenstiel skin incision was sharply excised, was taken down sharply. The fascia was incised transversely, extended superiorly and inferiorly off the bellies of the rectus muscle, which separated sharply in the midline.   Peritoneum was entered sharply.  O'Connor-O'Sullivan retractor was placed and the bowel was packed cephalad.  Kelly clamps were placed across the utero-ovarian ligaments bilaterally.  Round ligaments were identified, ligated bilaterally and the bladder was dissected off the anterior surface of the cervix.  The utero-ovarian ligaments were ligated with LigaSure instrument and dissected with Mayo scissors down to the level of the uterine vessels, and inferior portion of the broad ligament again using LigaSure and Mayo scissors.  The ovaries and tubes fell lateral with good hemostasis achieved.  Uterine vasculature was ligated with LigaSure instruments down across the cardinal ligaments down to the uterosacral ligaments.  Uterosacral ligaments were then grasped with Heaney clamps and suture ligated with 0 Monocryl suture. The vagina was entered sharply and the uterus and cervix were removed intact.  The vagina was then closed with figure-of-eights of 0 Monocryl suture with good approximation and good hemostasis achieved after the bladder had been dissected off the anterior surface of the cervix. Small bleeders behind the bladder were achieved.  Hemostasis was achieved with a figure-of-eight of 2-0 chromic.  The uterosacral ligaments were then plicated in the midline with good approximation noted.  The ovaries were then tacked to the round ligaments using figure- of-eights of 0 Monocryl suture.  After copious amount of irrigation, adequate hemostasis was assured.  Packing was removed.  The Lenox Ahr retractor was removed.  The  peritoneum was closed with a 2-0 Vicryl suture.  Rectus muscle was plicated in the midline.  The fascia was then closed with a #1 Vicryl with good approximation and good hemostasis was achieved.  The previous scar had been removed.  The Camper's fascia was undermined with Bovie cautery and hemostasis was achieved.  It was then reapproximated with 2-0 plain  suture.  The skin was then stapled.  The patient was transferred to recovery room in stable condition.  Sponge and instrument counts were normal x3. Estimated blood loss was 250 mL.  The patient received 2 g of cefotetan preoperatively.     Dineen Kid Rana Snare, M.D.     DCL/MEDQ  D:  08/31/2012  T:  08/31/2012  Job:  [id number]

## 2012-08-31 NOTE — Brief Op Note (Addendum)
08/31/2012  8:56 AM  PATIENT:  Ana [name]  41 y.o. female  PRE-OPERATIVE DIAGNOSIS:  10 week size fibroids, pelvic pain, menorrhagia, dysparuenia  POST-OPERATIVE DIAGNOSIS:  10 week size fibroids, pelvic pain, menorrhagia, dysparuenia  PROCEDURE:  Procedure(s) (LRB) with comments: HYSTERECTOMY ABDOMINAL (N/A)  SURGEON:  Surgeon(s) and Role:    * Turner Daniels, MD - Primary    * Jeani Hawking, MD - Assisting  PHYSICIAN ASSISTANT:   ASSISTANTS: none   ANESTHESIA:   general  EBL:  Total I/O In: -  Out: 600 [Urine:350; Blood:250]  BLOOD ADMINISTERED:none  DRAINS: Urinary Catheter (Foley)   LOCAL MEDICATIONS USED:  NONE  SPECIMEN:  Source of Specimen:  uterus  DISPOSITION OF SPECIMEN:  PATHOLOGY  COUNTS:  YES  TOURNIQUET:  * No tourniquets in log *  DICTATION: .Other Dictation: Dictation Number [id number]  PLAN OF CARE: Admit to inpatient   PATIENT DISPOSITION:  PACU - hemodynamically stable.   Delay start of Pharmacological VTE agent (>24hrs) due to surgical blood loss or risk of bleeding: not applicable

## 2012-08-31 NOTE — Preoperative (Signed)
Beta Blockers   Reason not to administer Beta Blockers:Not Applicable 

## 2012-08-31 NOTE — Transfer of Care (Signed)
Immediate Anesthesia Transfer of Care Note  Patient: Ana [name]  Procedure(s) Performed: Procedure(s) (LRB) with comments: HYSTERECTOMY ABDOMINAL (N/A)  Patient Location: PACU  Anesthesia Type:General  Level of Consciousness: awake, alert , oriented and patient cooperative  Airway & Oxygen Therapy: Patient Spontanous Breathing and Patient connected to nasal cannula oxygen  Post-op Assessment: Report given to PACU RN and Post -op Vital signs reviewed and stable  Post vital signs: Reviewed and stable  Complications: No apparent anesthesia complications

## 2012-08-31 NOTE — Anesthesia Postprocedure Evaluation (Signed)
  Anesthesia Post-op Note  Patient: Ana [name]  Procedure(s) Performed: Procedure(s) (LRB) with comments: HYSTERECTOMY ABDOMINAL (N/A)  Patient Location: PACU  Anesthesia Type:General  Level of Consciousness: awake, alert  and oriented  Airway and Oxygen Therapy: Patient Spontanous Breathing  Post-op Pain: moderate  Post-op Assessment: Post-op Vital signs reviewed, Patient's Cardiovascular Status Stable, Respiratory Function Stable, Patent Airway and No signs of Nausea or vomiting  Post-op Vital Signs: Reviewed and stable  Complications: No apparent anesthesia complications

## 2012-08-31 NOTE — Addendum Note (Signed)
Addendum  created 08/31/12 1747 by Elbert Ewings, CRNA   Modules edited:Notes Section

## 2012-08-31 NOTE — Anesthesia Postprocedure Evaluation (Signed)
  Anesthesia Post-op Note  Patient: Ana [name]  Procedure(s) Performed: Procedure(s) (LRB) with comments: HYSTERECTOMY ABDOMINAL (N/A)  Patient Location: PACU and Women's Unit  Anesthesia Type:General  Level of Consciousness: awake, alert  and oriented  Airway and Oxygen Therapy: Patient Spontanous Breathing and Patient connected to nasal cannula oxygen  Post-op Pain: mild  Post-op Assessment: Patient's Cardiovascular Status Stable, Respiratory Function Stable, No signs of Nausea or vomiting and Pain level controlled  Post-op Vital Signs: Reviewed  Complications: No apparent anesthesia complications

## 2012-09-01 ENCOUNTER — Encounter (HOSPITAL_COMMUNITY): Payer: Self-pay | Admitting: Obstetrics and Gynecology

## 2012-09-01 LAB — CBC
HCT: 35.6 % — ABNORMAL LOW (ref 36.0–46.0)
Hemoglobin: 12.1 g/dL (ref 12.0–15.0)
MCHC: 34 g/dL (ref 30.0–36.0)
MCV: 83.8 fL (ref 78.0–100.0)
RDW: 12 % (ref 11.5–15.5)
WBC: 11.6 10*3/uL — ABNORMAL HIGH (ref 4.0–10.5)

## 2012-09-01 NOTE — Progress Notes (Signed)
UR done. 

## 2012-09-01 NOTE — Progress Notes (Signed)
1 Day Post-Op Procedure(s) (LRB): HYSTERECTOMY ABDOMINAL (N/A)  Subjective: Patient reports tolerating PO and no problems voiding.    Objective: I have reviewed patient's vital signs, intake and output, medications and labs.  General: alert, cooperative, appears older than stated age and no distress GI: soft, non-tender; bowel sounds normal; no masses,  no organomegaly  Assessment: s/p Procedure(s) (LRB) with comments: HYSTERECTOMY ABDOMINAL (N/A): stable and progressing well  Plan: Advance diet Encourage ambulation Advance to PO medication  LOS: 1 day    Abdurrahman Petersheim C 09/01/2012, 10:25 AM

## 2012-09-02 MED ORDER — IBUPROFEN 600 MG PO TABS: 600.0000 mg | ORAL_TABLET | Freq: Four times a day (QID) | ORAL | Status: DC | PRN

## 2012-09-02 MED ORDER — ZOLPIDEM TARTRATE 5 MG PO TABS: 10.0000 mg | ORAL_TABLET | Freq: Every evening | ORAL | Status: DC | PRN

## 2012-09-02 MED ORDER — OXYCODONE-ACETAMINOPHEN 5-325 MG PO TABS: 1.0000 | ORAL_TABLET | ORAL | Status: DC | PRN

## 2012-09-02 NOTE — Progress Notes (Signed)
2 Days Post-Op Procedure(s) (LRB): HYSTERECTOMY ABDOMINAL (N/A)  Subjective: Patient reports tolerating PO, + flatus, + BM and no problems voiding.    Objective: I have reviewed patient's vital signs, intake and output, medications and labs.  General: alert, cooperative and no distress GI: soft, non-tender; bowel sounds normal; no masses,  no organomegaly and incision: clean, dry and intact  Assessment: s/p Procedure(s) (LRB) with comments: HYSTERECTOMY ABDOMINAL (N/A): stable and progressing well  Plan: Discharge home  LOS: 2 days    Erika Hussar C 09/02/2012, 10:00 AM

## 2012-09-02 NOTE — Progress Notes (Signed)
Patient discharged home.  Patient verbalized understanding of discharge instructions.  Patient understands to follow up with doctor Friday to have staples removed.  Patient to car per wheelchair.

## 2012-09-02 NOTE — Discharge Summary (Signed)
Physician Discharge Summary  Patient ID: Ana [name] MRN: [id number] DOB/AGE: [birth year] 41 y.o.  Admit date: 08/31/2012 Discharge date: 09/02/2012  Admission Diagnoses:  Discharge Diagnoses:  Active Problems:  * No active hospital problems. *    Discharged Condition: good  Hospital Course: Pt underwent uncomplicated TAH.  Her post op care was unremarkable with good return of bowel function, ambulation and by POD 2 was tolerating regular diet with pain well controlled and desired dc home  Consults: None  Significant Diagnostic Studies: labs:   Treatments: surgery: TAH  Discharge Exam: Blood pressure 127/77, pulse 73, temperature 98.2 F (36.8 C), temperature source Oral, resp. rate 18, height 5\' 4"  (1.626 m), weight 74.39 kg (164 lb), SpO2 99.00%. General appearance: alert, cooperative, appears stated age and no distress GI: soft, non-tender; bowel sounds normal; no masses,  no organomegaly and Incision CD&I  Disposition:   Discharge Orders    Future Orders Please Complete By Expires   Diet general      Increase activity slowly      Driving Restrictions      Scheduling Instructions:   Call [phone] for appt Friday to have staples removed   Comments:   No driving for 2 weeks   Lifting restrictions      Comments:   No lifting anything greater than 10 pounds (if you have to ask, don't lift it)   Sexual Activity Restrictions      Comments:   Nothing in the vagina for 6 weeks   Call MD for:  temperature >100.4      Call MD for:  persistant nausea and vomiting      Call MD for:  severe uncontrolled pain      Call MD for:  redness, tenderness, or signs of infection (pain, swelling, redness, odor or green/yellow discharge around incision site)      Call MD for:  difficulty breathing, headache or visual disturbances      Discharge instructions      Comments:   Friday for staple removal       Medication List     As of 09/02/2012 10:04 AM    STOP taking these  medications         JUNEL FE 1/20 PO      TAKE these medications         cetirizine 10 MG tablet   Commonly known as: ZYRTEC   Take 10 mg by mouth daily.      fluticasone 50 MCG/ACT nasal spray   Commonly known as: FLONASE   Place 1 spray into the nose daily.      ibuprofen 600 MG tablet   Commonly known as: ADVIL,MOTRIN   Take 1 tablet (600 mg total) by mouth every 6 (six) hours as needed (mild pain).      oxyCODONE-acetaminophen 5-325 MG per tablet   Commonly known as: PERCOCET/ROXICET   Take 1-2 tablets by mouth every 4 (four) hours as needed (moderate to severe pain (when tolerating fluids)).      zolpidem 5 MG tablet   Commonly known as: AMBIEN   Take 2 tablets (10 mg total) by mouth at bedtime as needed for sleep.         Signed: Karri Kallenbach C 09/02/2012, 10:04 AM

## 2012-11-30 ENCOUNTER — Ambulatory Visit (INDEPENDENT_AMBULATORY_CARE_PROVIDER_SITE_OTHER): Payer: BC Managed Care – PPO | Admitting: Family Medicine

## 2012-11-30 ENCOUNTER — Encounter: Payer: Self-pay | Admitting: Family Medicine

## 2012-11-30 VITALS — BP 110/80 | HR 79 | Temp 98.5°F | Ht 64.0 in | Wt 158.0 lb

## 2012-11-30 DIAGNOSIS — J019 Acute sinusitis, unspecified: Secondary | ICD-10-CM

## 2012-11-30 DIAGNOSIS — G4700 Insomnia, unspecified: Secondary | ICD-10-CM

## 2012-11-30 MED ORDER — TRAZODONE HCL 50 MG PO TABS: ORAL_TABLET | ORAL | Status: DC

## 2012-11-30 MED ORDER — FLUTICASONE PROPIONATE 50 MCG/ACT NA SUSP: 2.0000 | Freq: Every day | NASAL | Status: DC

## 2012-11-30 MED ORDER — AMOXICILLIN-POT CLAVULANATE 875-125 MG PO TABS: 1.0000 | ORAL_TABLET | Freq: Two times a day (BID) | ORAL | Status: DC

## 2012-11-30 NOTE — Progress Notes (Signed)
Office Note 12/06/2012  CC:  Chief Complaint  Patient presents with  . Establish Care    ? sinus infection    HPI:  Ana [name] is a 42 y.o. White female who is here to establish care and discuss respiratory symptoms. Patient's most recent primary MD:  [facility], [place], [place]. Old records were not reviewed prior to or during today's visit.  Pt presents complaining of respiratory symptoms for 10  days.  Primary symptoms are: nasal congestion, facial pressure, upper teeth pain, frontal/orbital HA, no fever, no ST, no cough.  Worst symptoms seems to be the sinus pressure.  Lately the symptoms seem to be worseing. Pertinent negatives: No fevers, no wheezing, and no SOB.  No pain in face or teeth.  No significant HA.  ST mild at most.   Symptoms made worse by exercise.  Symptoms improved by nothing. Smoker? no Recent sick contact? no Muscle or joint aches? no Flu shot this season at least 2 wks ago? yes  Additional ROS: no n/v/d or abdominal pain.  No rash.  No neck stiffness.   +Mild fatigue.  +Mild appetite loss.  Pt additionally asks about help with sleeping.  Ambien seemed to help when started around 08/2012 but lately it has not been helping much.  OTC melatonin trial caused a HA.  Lunesta caused GI upset.  Denies RLS sx's.    Past Medical History  Diagnosis Date  . Seasonal allergies   . Asthma     albuterol inhaler not current-last used 2 years  . GERD (gastroesophageal reflux disease)     controlled with diet  . Headache     Past Surgical History  Procedure Laterality Date  . Cesarean section      x2  . Myomectomy    . Dilation and curettage of uterus      missed ab  . Tonsillectomy    . Knee arthroscopy    . Wisdom tooth extraction    . Abdominal hysterectomy  08/31/2012    Procedure: HYSTERECTOMY ABDOMINAL;  Surgeon: Turner Daniels, MD;  Location: [facility];  Service: Gynecology;  Laterality: N/A;    Family History  Problem Relation Age of  Onset  . Arthritis Mother   . Hypertension Mother   . Hyperlipidemia Father   . Hypertension Father   . Cancer Paternal Grandmother     breast    History   Social History  . Marital Status: Married    Spouse Name: N/A    Number of Children: N/A  . Years of Education: N/A   Occupational History  . Not on file.   Social History Main Topics  . Smoking status: Never Smoker   . Smokeless tobacco: Never Used  . Alcohol Use: Yes     Comment: social  . Drug Use: No  . Sexually Active: Not on file   Other Topics Concern  . Not on file   Social History Narrative   Married.  Two kids.   College grad--[employer].   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   No T/A/Ds.         MEDS: zyrtec 10mg , ambien 10mg  qhs prn  ALLERGIES: nkda  (omnicef causes GI upset)  ROS Review of Systems  Constitutional: Negative for fever, chills, appetite change and fatigue.  HENT:       See HPI   Eyes: Negative for discharge, redness and visual disturbance.  Respiratory: Negative for chest tightness, shortness of breath and  wheezing.   Cardiovascular: Negative for chest pain, palpitations and leg swelling.  Gastrointestinal: Negative for nausea, vomiting, abdominal pain, diarrhea and blood in stool.  Genitourinary: Negative for dysuria, urgency, frequency, hematuria, flank pain and difficulty urinating.  Musculoskeletal: Negative for myalgias, back pain, joint swelling and arthralgias.  Skin: Negative for pallor and rash.  Neurological: Negative for dizziness, speech difficulty, weakness and headaches.  Hematological: Negative for adenopathy. Does not bruise/bleed easily.  Psychiatric/Behavioral: Positive for sleep disturbance (see hpi). Negative for confusion. The patient is not nervous/anxious.     PE; Blood pressure 110/80, pulse 79, temperature 98.5 F (36.9 C), temperature source Temporal, height 5\' 4"  (1.626 m), weight 158 lb (71.668 kg), last menstrual period 07/30/2012,  SpO2 94.00%. VS: noted--normal. Gen: alert, NAD, NONTOXIC APPEARING. HEENT: eyes without injection, drainage, or swelling.  Ears: EACs clear, TMs with normal light reflex and landmarks.  Nose: Clear rhinorrhea, with some dried, crusty exudate adherent to mildly injected mucosa.  No purulent d/c.  No paranasal sinus TTP.  No facial swelling.  Throat and mouth without focal lesion.  No pharyngial swelling, erythema, or exudate.   Neck: supple, no LAD.   LUNGS: CTA bilat, nonlabored resps.   CV: RRR, no m/r/g. EXT: no c/c/e SKIN: no rash   Pertinent labs:  None today  ASSESSMENT AND PLAN:   New pt today: obtain old records  Sinusitis, acute Restart flonase. Augmentin 875mg  bid x10d.   Insomnia D/C zolpidem and start trial of trazodone 50mg : start with 1/2 tab qhs, titrate by 1/2 tab every few days prn up to 2 tabs max for now.   An After Visit Summary was printed and given to the patient.  Return in about 1 month (around 12/28/2012) for whichever pt wants: 30 min CPE appt or 15 min f/u insomnia appt.

## 2012-12-01 ENCOUNTER — Telehealth: Payer: Self-pay | Admitting: *Deleted

## 2012-12-01 NOTE — Telephone Encounter (Signed)
FYI Pt left voicemail stating Trazodone made her very sick after taking.  RC to pt.  States she began having severe nausea and vomiting about 1 hour after taking TRAZODONE, this worsened through the night and she is still having nausea.  She took one whole tablet.  Pt states Trazodone made her feel more loopy and drunk than sleepy.  Pt still feels like she has hangover.  Pt had taken augmentin several hours earlier.  Pt has taken Augmentin in the past without any issues.  Pt has thrown Trazodone away and never wants to take it again.  She will go back to Ambien for now and discuss sleep issues at next office visit.  Pt has not taken Augmentin today due to continued nausea.

## 2012-12-02 NOTE — Telephone Encounter (Signed)
Noted.  Will put trazodone intolerance on list in her chart.

## 2012-12-06 ENCOUNTER — Encounter: Payer: Self-pay | Admitting: Family Medicine

## 2012-12-06 DIAGNOSIS — J019 Acute sinusitis, unspecified: Secondary | ICD-10-CM | POA: Insufficient documentation

## 2012-12-06 DIAGNOSIS — G4700 Insomnia, unspecified: Secondary | ICD-10-CM | POA: Insufficient documentation

## 2012-12-06 NOTE — Assessment & Plan Note (Signed)
D/C zolpidem and start trial of trazodone 50mg : start with 1/2 tab qhs, titrate by 1/2 tab every few days prn up to 2 tabs max for now.

## 2012-12-06 NOTE — Assessment & Plan Note (Signed)
Restart flonase. Augmentin 875mg  bid x10d.

## 2013-08-23 ENCOUNTER — Other Ambulatory Visit: Payer: Self-pay | Admitting: Family Medicine

## 2013-08-23 DIAGNOSIS — Z0000 Encounter for general adult medical examination without abnormal findings: Secondary | ICD-10-CM

## 2013-08-27 ENCOUNTER — Encounter: Payer: BC Managed Care – PPO | Admitting: Family Medicine

## 2013-09-08 ENCOUNTER — Encounter: Payer: BC Managed Care – PPO | Admitting: Family Medicine

## 2013-10-08 ENCOUNTER — Other Ambulatory Visit: Payer: Self-pay | Admitting: Obstetrics and Gynecology

## 2013-10-08 DIAGNOSIS — R928 Other abnormal and inconclusive findings on diagnostic imaging of breast: Secondary | ICD-10-CM

## 2013-10-15 ENCOUNTER — Other Ambulatory Visit: Payer: Self-pay | Admitting: Obstetrics and Gynecology

## 2013-10-15 ENCOUNTER — Ambulatory Visit
Admission: RE | Admit: 2013-10-15 | Discharge: 2013-10-15 | Disposition: A | Discharge status: Home / Self Care | Payer: BC Managed Care – PPO | Source: Ambulatory Visit | Attending: Obstetrics and Gynecology | Admitting: Obstetrics and Gynecology

## 2013-10-15 DIAGNOSIS — R928 Other abnormal and inconclusive findings on diagnostic imaging of breast: Secondary | ICD-10-CM

## 2014-04-28 ENCOUNTER — Encounter: Payer: Self-pay | Admitting: Sports Medicine

## 2014-04-28 ENCOUNTER — Ambulatory Visit (INDEPENDENT_AMBULATORY_CARE_PROVIDER_SITE_OTHER): Payer: BC Managed Care – PPO | Admitting: Sports Medicine

## 2014-04-28 VITALS — BP 114/66 | HR 77 | Ht 64.0 in | Wt 158.0 lb

## 2014-04-28 DIAGNOSIS — Z0000 Encounter for general adult medical examination without abnormal findings: Secondary | ICD-10-CM | POA: Insufficient documentation

## 2014-04-28 DIAGNOSIS — Z299 Encounter for prophylactic measures, unspecified: Secondary | ICD-10-CM

## 2014-04-28 DIAGNOSIS — M6740 Ganglion, unspecified site: Secondary | ICD-10-CM

## 2014-04-28 DIAGNOSIS — G4700 Insomnia, unspecified: Secondary | ICD-10-CM

## 2014-04-28 DIAGNOSIS — M67472 Ganglion, left ankle and foot: Secondary | ICD-10-CM | POA: Insufficient documentation

## 2014-04-28 DIAGNOSIS — M25511 Pain in right shoulder: Secondary | ICD-10-CM | POA: Insufficient documentation

## 2014-04-28 DIAGNOSIS — E785 Hyperlipidemia, unspecified: Secondary | ICD-10-CM

## 2014-04-28 DIAGNOSIS — R238 Other skin changes: Secondary | ICD-10-CM | POA: Insufficient documentation

## 2014-04-28 DIAGNOSIS — L988 Other specified disorders of the skin and subcutaneous tissue: Secondary | ICD-10-CM

## 2014-04-28 DIAGNOSIS — M25519 Pain in unspecified shoulder: Secondary | ICD-10-CM

## 2014-04-28 NOTE — Assessment & Plan Note (Signed)
Continue Ambien each bedtime.

## 2014-04-28 NOTE — Assessment & Plan Note (Signed)
Home rehabilitation. Aleve over-the-counter. Return in a month, if no better injection and formal physical therapy.

## 2014-04-28 NOTE — Progress Notes (Signed)
  Subjective:    CC: Establish care.   HPI:  Right shoulder pain: Present for years, worse over the deltoid with overhead activities. Nothing has been done yet. Moderate, persistent. No radiation.  Hyperlipidemia: Noted in previous blood work, patient does not know the actual numbers. Has already tried diet and exercise.  Left foot pain: There is a palpable mass, tender over the dorsum of the left foot. She has been told she has arthritis in the past.  Insomnia: Well controlled with him being.  Lump on foot: Noticed over the left heel. Nontender.  Past medical history, Surgical history, Family history not pertinant except as noted below, Social history, Allergies, and medications have been entered into the medical record, reviewed, and no changes needed.   Review of Systems: No headache, visual changes, nausea, vomiting, diarrhea, constipation, dizziness, abdominal pain, skin rash, fevers, chills, night sweats, swollen lymph nodes, weight loss, chest pain, body aches, joint swelling, muscle aches, shortness of breath, mood changes, visual or auditory hallucinations.  Objective:    General: Well Developed, well nourished, and in no acute distress.  Neuro: Alert and oriented x3, extra-ocular muscles intact, sensation grossly intact.  HEENT: Normocephalic, atraumatic, pupils equal round reactive to light, neck supple, no masses, no lymphadenopathy, thyroid nonpalpable.  Skin: Warm and dry, no rashes noted.  Cardiac: Regular rate and rhythm, no murmurs rubs or gallops.  Respiratory: Clear to auscultation bilaterally. Not using accessory muscles, speaking in full sentences.  Abdominal: Soft, nontender, nondistended, positive bowel sounds, no masses, no organomegaly.  Right Shoulder: Inspection reveals no abnormalities, atrophy or asymmetry. Palpation is normal with no tenderness over AC joint or bicipital groove. ROM is full in all planes. Rotator cuff strength normal throughout. Positive  Neer and Hawkin's tests, empty can. Speeds and Yergason's tests normal. No labral pathology noted with negative Obrien's, negative crank, negative clunk, and good stability. Normal scapular function observed. No painful arc and no drop arm sign. No apprehension sign Left Foot: Visible and palpable well-defined movable mass over the dorsum of the tarsometatarsal articulation. Range of motion is full in all directions. Strength is 5/5 in all directions. No hallux valgus. No pes cavus or pes planus. No abnormal callus noted. No pain over the navicular prominence, or base of fifth metatarsal. No tenderness to palpation of the calcaneal insertion of plantar fascia. No pain at the Achilles insertion. No pain over the calcaneal bursa. No pain of the retrocalcaneal bursa. No tenderness to palpation over the tarsals, metatarsals, or phalanges. No hallux rigidus or limitus. No tenderness palpation over interphalangeal joints. No pain with compression of the metatarsal heads. Neurovascularly intact distally. Noted peizogenic pedal papules  Impression and Recommendations:    The patient was counselled, risk factors were discussed, anticipatory guidance given.

## 2014-04-28 NOTE — Assessment & Plan Note (Signed)
Up-to-date, post hysterectomy for fibroids so no Pap smears needed.

## 2014-04-28 NOTE — Assessment & Plan Note (Signed)
No intervention needed. 

## 2014-04-28 NOTE — Assessment & Plan Note (Signed)
Return for custom orthotics. We can always consider an intertarsal injection if no better.

## 2014-04-28 NOTE — Assessment & Plan Note (Signed)
Checking lipids. We will likely be starting a statin.

## 2014-05-05 LAB — COMPREHENSIVE METABOLIC PANEL
Alkaline Phosphatase: 64 U/L (ref 39–117)
BUN: 16 mg/dL (ref 6–23)
CO2: 26 mEq/L (ref 19–32)
Creat: 0.8 mg/dL (ref 0.50–1.10)
Glucose, Bld: 82 mg/dL (ref 70–99)
Sodium: 137 mEq/L (ref 135–145)
Total Bilirubin: 0.7 mg/dL (ref 0.2–1.2)
Total Protein: 6.9 g/dL (ref 6.0–8.3)

## 2014-05-05 LAB — CBC
HCT: 40.2 % (ref 36.0–46.0)
Hemoglobin: 14 g/dL (ref 12.0–15.0)
MCH: 28.7 pg (ref 26.0–34.0)
MCHC: 34.8 g/dL (ref 30.0–36.0)
MCV: 82.5 fL (ref 78.0–100.0)
Platelets: 205 K/uL (ref 150–400)
RBC: 4.87 MIL/uL (ref 3.87–5.11)
RDW: 13.1 % (ref 11.5–15.5)
WBC: 6.5 K/uL (ref 4.0–10.5)

## 2014-05-05 LAB — COMPREHENSIVE METABOLIC PANEL WITH GFR
ALT: 32 U/L (ref 0–35)
AST: 22 U/L (ref 0–37)
Albumin: 4.5 g/dL (ref 3.5–5.2)
Calcium: 9.2 mg/dL (ref 8.4–10.5)
Chloride: 104 meq/L (ref 96–112)
Potassium: 4.1 meq/L (ref 3.5–5.3)

## 2014-05-05 LAB — LIPID PANEL
Cholesterol: 217 mg/dL — ABNORMAL HIGH (ref 0–200)
HDL: 75 mg/dL (ref >39)
LDL Cholesterol: 125 mg/dL — ABNORMAL HIGH (ref 0–99)
Total CHOL/HDL Ratio: 2.9 Ratio
Triglycerides: 84 mg/dL (ref <150)
VLDL: 17 mg/dL (ref 0–40)

## 2014-05-05 LAB — HEMOGLOBIN A1C
Hgb A1c MFr Bld: 5.1 % (ref <5.7)
Mean Plasma Glucose: 100 mg/dL (ref <117)

## 2014-05-05 LAB — TSH: TSH: 1.375 u[IU]/mL (ref 0.350–4.500)

## 2014-05-06 LAB — VITAMIN D 25 HYDROXY (VIT D DEFICIENCY, FRACTURES): Vit D, 25-Hydroxy: 36 ng/mL (ref 30–89)

## 2014-05-09 ENCOUNTER — Telehealth: Payer: Self-pay

## 2014-05-09 MED ORDER — ZOLPIDEM TARTRATE 10 MG PO TABS: 10.0000 mg | ORAL_TABLET | Freq: Every day | ORAL | Status: DC

## 2014-05-09 NOTE — Telephone Encounter (Signed)
Patient request refill for Ambien 10 mg. Rhonda Cunningham,CMA

## 2014-05-09 NOTE — Telephone Encounter (Signed)
Three-month supply given, prescription in box

## 2014-05-09 NOTE — Telephone Encounter (Signed)
Patient has been informed that Ambien has been faxed to [facility]

## 2014-05-17 ENCOUNTER — Encounter: Payer: BC Managed Care – PPO | Admitting: Sports Medicine

## 2014-05-25 ENCOUNTER — Ambulatory Visit: Payer: BC Managed Care – PPO | Admitting: Sports Medicine

## 2014-05-25 ENCOUNTER — Telehealth: Payer: Self-pay

## 2014-05-25 MED ORDER — MUPIROCIN 2 % EX OINT: TOPICAL_OINTMENT | CUTANEOUS | Status: DC

## 2014-05-25 NOTE — Telephone Encounter (Signed)
Left detailed message on patient mvm advising her that ointment has been sent to [facility]. Calven Gilkes,CMA

## 2014-05-25 NOTE — Telephone Encounter (Signed)
Patient called request a Rx for antibiotic eye gel for a stye that she has over her eye. Rhonda Cunningham,CMA

## 2014-05-25 NOTE — Telephone Encounter (Signed)
Calling in topical mupirocin. To be placed over the eyelid and over the sty, do not get into the eye

## 2014-05-31 ENCOUNTER — Ambulatory Visit (INDEPENDENT_AMBULATORY_CARE_PROVIDER_SITE_OTHER): Payer: BC Managed Care – PPO | Admitting: Sports Medicine

## 2014-05-31 ENCOUNTER — Encounter: Payer: Self-pay | Admitting: Sports Medicine

## 2014-05-31 VITALS — BP 121/74 | HR 76 | Ht 64.0 in | Wt 159.0 lb

## 2014-05-31 DIAGNOSIS — M25519 Pain in unspecified shoulder: Secondary | ICD-10-CM

## 2014-05-31 DIAGNOSIS — H00019 Hordeolum externum unspecified eye, unspecified eyelid: Secondary | ICD-10-CM

## 2014-05-31 DIAGNOSIS — H00013 Hordeolum externum right eye, unspecified eyelid: Secondary | ICD-10-CM

## 2014-05-31 DIAGNOSIS — M25511 Pain in right shoulder: Secondary | ICD-10-CM

## 2014-05-31 MED ORDER — DOXYCYCLINE HYCLATE 100 MG PO TABS: 100.0000 mg | ORAL_TABLET | Freq: Two times a day (BID) | ORAL | Status: AC

## 2014-05-31 NOTE — Progress Notes (Signed)
  Subjective:    CC: Recheck shoulder  HPI: Right shoulder pain: Symptoms are classic impingement, she is a swimmer, she unfortunately has not been entirely compliant with her home rehabilitation exercises, and has not yet noted improvement with naproxen. Her husband does have some leftover meloxicam which she is agreeable to try. She has not yet want to proceed with interventional treatment today.  She also has complaints of stye in her right eye. She has tried topical antibiotics for 3 weeks without any improvement. No visual changes.  Past medical history, Surgical history, Family history not pertinant except as noted below, Social history, Allergies, and medications have been entered into the medical record, reviewed, and no changes needed.   Review of Systems: No fevers, chills, night sweats, weight loss, chest pain, or shortness of breath.   Objective:    General: Well Developed, well nourished, and in no acute distress.  Neuro: Alert and oriented x3, extra-ocular muscles intact, sensation grossly intact.  HEENT: Normocephalic, atraumatic, pupils equal round reactive to light, neck supple, no masses, no lymphadenopathy, thyroid nonpalpable. Stye noted on right inferior eyelid. Skin: Warm and dry, no rashes. Cardiac: Regular rate and rhythm, no murmurs rubs or gallops, no lower extremity edema.  Respiratory: Clear to auscultation bilaterally. Not using accessory muscles, speaking in full sentences. Right Shoulder: Inspection reveals no abnormalities, atrophy or asymmetry. Palpation is normal with no tenderness over AC joint or bicipital groove. ROM is full in all planes. Rotator cuff strength normal throughout. Positive Neer and Hawkin's tests, empty can. Speeds and Yergason's tests normal. No labral pathology noted with negative Obrien's, negative crank, negative clunk, and good stability. Normal scapular function observed. No painful arc and no drop arm sign. No apprehension  sign  Impression and Recommendations:

## 2014-05-31 NOTE — Assessment & Plan Note (Signed)
Persistent impingement symptoms but not yet ready to proceed with interventional treatment. She will try some of her husband's Mobic and to do formal physical therapy. Return in a month.

## 2014-05-31 NOTE — Patient Instructions (Signed)
Sty A sty (hordeolum) is an infection of a gland in the eyelid located at the base of the eyelash. A sty may develop a white or yellow head of pus. It can be puffy (swollen). Usually, the sty will burst and pus will come out on its own. They do not leave lumps in the eyelid once they drain. A sty is often confused with another form of cyst of the eyelid called a chalazion. Chalazions occur within the eyelid and not on the edge where the bases of the eyelashes are. They often are red, sore and then form firm lumps in the eyelid. CAUSES   Germs (bacteria).  Lasting (chronic) eyelid inflammation. SYMPTOMS   Tenderness, redness and swelling along the edge of the eyelid at the base of the eyelashes.  Sometimes, there is a white or yellow head of pus. It may or may not drain. DIAGNOSIS  An ophthalmologist will be able to distinguish between a sty and a chalazion and treat the condition appropriately.  TREATMENT   Styes are typically treated with warm packs (compresses) until drainage occurs.  In rare cases, medicines that kill germs (antibiotics) may be prescribed. These antibiotics may be in the form of drops, cream or pills.  If a hard lump has formed, it is generally necessary to do a small incision and remove the hardened contents of the cyst in a minor surgical procedure done in the office.  In suspicious cases, your caregiver may send the contents of the cyst to the lab to be certain that it is not a rare, but dangerous form of cancer of the glands of the eyelid. HOME CARE INSTRUCTIONS   Wash your hands often and dry them with a clean towel. Avoid touching your eyelid. This may spread the infection to other parts of the eye.  Apply heat to your eyelid for 10 to 20 minutes, several times a day, to ease pain and help to heal it faster.  Do not squeeze the sty. Allow it to drain on its own. Wash your eyelid carefully 3 to 4 times per day to remove any pus. SEEK IMMEDIATE MEDICAL CARE IF:    Your eye becomes painful or puffy (swollen).  Your vision changes.  Your sty does not drain by itself within 3 days.  Your sty comes back within a short period of time, even with treatment.  You have redness (inflammation) around the eye.  You have a fever. Document Released: 07/10/2005 Document Revised: 12/23/2011 Document Reviewed: 01/14/2014 ExitCare Patient Information 2015 [employer]. This information is not intended to replace advice given to you by your health care provider. Make sure you discuss any questions you have with your health care provider.  

## 2014-05-31 NOTE — Assessment & Plan Note (Signed)
Failure of topical antibiotics for 3 weeks, starting doxycycline twice a day for 7 days.

## 2014-06-28 ENCOUNTER — Ambulatory Visit: Payer: BC Managed Care – PPO | Admitting: Sports Medicine

## 2014-07-08 ENCOUNTER — Telehealth: Payer: Self-pay

## 2014-07-08 NOTE — Telephone Encounter (Signed)
90 tabs given on 05/09/2014, call back near the end of next month, should have another month remaining.

## 2014-07-08 NOTE — Telephone Encounter (Signed)
Patient request a refill for Ambien. Deane Wattenbarger,CMA

## 2014-07-08 NOTE — Telephone Encounter (Signed)
Left message on patient vm with instructions as noted below. Raven Harmes,CMA

## 2014-07-11 NOTE — Telephone Encounter (Signed)
error 

## 2014-08-08 ENCOUNTER — Telehealth: Payer: Self-pay

## 2014-08-08 MED ORDER — ZOLPIDEM TARTRATE 10 MG PO TABS: 10.0000 mg | ORAL_TABLET | Freq: Every day | ORAL | Status: DC

## 2014-08-08 NOTE — Telephone Encounter (Signed)
Patient called requested a refill for Ambien 10 mg sent to [place] [facility]. Rhyleigh Grassel,CMA

## 2014-08-08 NOTE — Telephone Encounter (Signed)
Rx in box. 

## 2014-08-09 NOTE — Telephone Encounter (Signed)
[facility]. Mischele Detter,CMA  

## 2014-11-17 ENCOUNTER — Other Ambulatory Visit: Payer: Self-pay | Admitting: Obstetrics and Gynecology

## 2014-11-17 DIAGNOSIS — R928 Other abnormal and inconclusive findings on diagnostic imaging of breast: Secondary | ICD-10-CM

## 2014-11-28 ENCOUNTER — Inpatient Hospital Stay: Admission: RE | Admit: 2014-11-28 | Payer: Self-pay | Source: Ambulatory Visit

## 2014-11-28 ENCOUNTER — Other Ambulatory Visit: Payer: Self-pay

## 2014-12-05 ENCOUNTER — Ambulatory Visit
Admission: RE | Admit: 2014-12-05 | Discharge: 2014-12-05 | Disposition: A | Discharge status: Home / Self Care | Payer: 59 | Source: Ambulatory Visit | Attending: Obstetrics and Gynecology | Admitting: Obstetrics and Gynecology

## 2014-12-05 DIAGNOSIS — R928 Other abnormal and inconclusive findings on diagnostic imaging of breast: Secondary | ICD-10-CM

## 2014-12-08 ENCOUNTER — Telehealth: Payer: Self-pay

## 2014-12-08 MED ORDER — ZOLPIDEM TARTRATE 10 MG PO TABS: 10.0000 mg | ORAL_TABLET | Freq: Every day | ORAL | Status: DC

## 2014-12-08 NOTE — Telephone Encounter (Signed)
Patient request refill for Ambien 10 mg #30 0 [facility], advised patient that appt is needed for further refills. ;patient did schedule appt today. Rhonda Cunningham,CMA

## 2014-12-23 ENCOUNTER — Encounter: Payer: Self-pay | Admitting: Sports Medicine

## 2014-12-23 ENCOUNTER — Ambulatory Visit (INDEPENDENT_AMBULATORY_CARE_PROVIDER_SITE_OTHER): Payer: 59 | Admitting: Sports Medicine

## 2014-12-23 VITALS — BP 123/79 | HR 81 | Ht 64.0 in | Wt 157.0 lb

## 2014-12-23 DIAGNOSIS — E663 Overweight: Secondary | ICD-10-CM | POA: Diagnosis not present

## 2014-12-23 DIAGNOSIS — G4700 Insomnia, unspecified: Secondary | ICD-10-CM | POA: Diagnosis not present

## 2014-12-23 DIAGNOSIS — H00019 Hordeolum externum unspecified eye, unspecified eyelid: Secondary | ICD-10-CM | POA: Diagnosis not present

## 2014-12-23 DIAGNOSIS — Z299 Encounter for prophylactic measures, unspecified: Secondary | ICD-10-CM

## 2014-12-23 MED ORDER — PHENTERMINE HCL 37.5 MG PO TABS: ORAL_TABLET | ORAL | Status: DC

## 2014-12-23 MED ORDER — ZOLPIDEM TARTRATE 10 MG PO TABS: 10.0000 mg | ORAL_TABLET | Freq: Every day | ORAL | Status: DC

## 2014-12-23 MED ORDER — DOXYCYCLINE HYCLATE 100 MG PO TABS: 100.0000 mg | ORAL_TABLET | Freq: Two times a day (BID) | ORAL | Status: AC

## 2014-12-23 MED ORDER — MUPIROCIN 2 % EX OINT: TOPICAL_OINTMENT | CUTANEOUS | Status: DC

## 2014-12-23 NOTE — Assessment & Plan Note (Signed)
Vaccine today.

## 2014-12-23 NOTE — Assessment & Plan Note (Signed)
Extremely well controlled on Ambien 10. Refilling medication for one year.

## 2014-12-23 NOTE — Assessment & Plan Note (Signed)
I'm going to give her a prescription for the topical as well as doxycycline to hold onto, she can use it if the stye recurs.

## 2014-12-23 NOTE — Assessment & Plan Note (Signed)
Starting phentermine. Return monthly for weight checks and refills. Goal is approximately 120 to 130 pounds.

## 2014-12-23 NOTE — Progress Notes (Signed)
  Subjective:    CC: Follow-up  HPI: Overweight: Difficulty losing weight, would like to try weight loss medication.  Insomnia: Well controlled with Ambien.  Preventive measures: Due for a Tdap.  Recurrent styes: Responded well to doxycycline.  Past medical history, Surgical history, Family history not pertinant except as noted below, Social history, Allergies, and medications have been entered into the medical record, reviewed, and no changes needed.   Review of Systems: No fevers, chills, night sweats, weight loss, chest pain, or shortness of breath.   Objective:    General: Well Developed, well nourished, and in no acute distress.  Neuro: Alert and oriented x3, extra-ocular muscles intact, sensation grossly intact.  HEENT: Normocephalic, atraumatic, pupils equal round reactive to light, neck supple, no masses, no lymphadenopathy, thyroid nonpalpable.  Skin: Warm and dry, no rashes. Cardiac: Regular rate and rhythm, no murmurs rubs or gallops, no lower extremity edema.  Respiratory: Clear to auscultation bilaterally. Not using accessory muscles, speaking in full sentences.  Impression and Recommendations:    I spent 40 minutes with this patient, greater than 50% was face-to-face time counseling regarding the above multiple diagnoses

## 2014-12-27 ENCOUNTER — Telehealth: Payer: Self-pay

## 2014-12-27 MED ORDER — ZOLPIDEM TARTRATE ER 12.5 MG PO TBCR: 12.5000 mg | EXTENDED_RELEASE_TABLET | Freq: Every evening | ORAL | Status: DC | PRN

## 2014-12-27 NOTE — Telephone Encounter (Signed)
[facility]

## 2014-12-27 NOTE — Telephone Encounter (Signed)
Switch phentermine to first thing in the morning if not taking at this time already, rx for CR ambien in box. If already taking phentermine in the morning, drop to one half tab for a week. Also don't eat if not hungry!

## 2014-12-27 NOTE — Telephone Encounter (Signed)
Patient called stated that with her taking the Phentermine and Ambien together its not making her sleep, she stated that she is sleeping less being on the Phentermine and it is starting to effect her, she is requesting a Rx for the Ambien CR. Ryshawn Sanzone,CMA

## 2014-12-28 ENCOUNTER — Telehealth: Payer: Self-pay

## 2014-12-28 NOTE — Telephone Encounter (Signed)
Received Fax requesting Pa on Zolpidem tart Er 12.5mg  tab. Sent through cover my meds waiting on auth. - CF

## 2015-01-20 ENCOUNTER — Encounter: Payer: Self-pay | Admitting: Sports Medicine

## 2015-01-20 ENCOUNTER — Ambulatory Visit (INDEPENDENT_AMBULATORY_CARE_PROVIDER_SITE_OTHER): Payer: 59 | Admitting: Sports Medicine

## 2015-01-20 VITALS — BP 135/86 | HR 93 | Ht 64.0 in | Wt 148.0 lb

## 2015-01-20 DIAGNOSIS — E663 Overweight: Secondary | ICD-10-CM | POA: Diagnosis not present

## 2015-01-20 DIAGNOSIS — G4700 Insomnia, unspecified: Secondary | ICD-10-CM | POA: Diagnosis not present

## 2015-01-20 MED ORDER — PHENTERMINE HCL 37.5 MG PO TABS: ORAL_TABLET | ORAL | Status: DC

## 2015-01-20 NOTE — Progress Notes (Signed)
  Subjective:    CC: Follow-up  HPI: Abnormal weight gain: 9 pound weight loss in the last month on phentermine, doing well.  Her goal weight is between 125 and 135 pounds.  Insomnia: Resolved, she did switch back to standard Ambien, and it has been working better.  Past medical history, Surgical history, Family history not pertinant except as noted below, Social history, Allergies, and medications have been entered into the medical record, reviewed, and no changes needed.   Review of Systems: No fevers, chills, night sweats, weight loss, chest pain, or shortness of breath.   Objective:    General: Well Developed, well nourished, and in no acute distress.  Neuro: Alert and oriented x3, extra-ocular muscles intact, sensation grossly intact.  HEENT: Normocephalic, atraumatic, pupils equal round reactive to light, neck supple, no masses, no lymphadenopathy, thyroid nonpalpable.  Skin: Warm and dry, no rashes. Cardiac: Regular rate and rhythm, no murmurs rubs or gallops, no lower extremity edema.  Respiratory: Clear to auscultation bilaterally. Not using accessory muscles, speaking in full sentences.  Impression and Recommendations:

## 2015-01-20 NOTE — Assessment & Plan Note (Signed)
Excellent weight loss, 9 pounds. Return in one month, refilling phentermine as we enter her second month. Goal weight is 125-135 pounds.

## 2015-01-20 NOTE — Assessment & Plan Note (Signed)
Switched back to standard release Ambien, doing well.

## 2015-01-23 ENCOUNTER — Encounter: Payer: Self-pay | Admitting: Sports Medicine

## 2015-01-23 ENCOUNTER — Ambulatory Visit (INDEPENDENT_AMBULATORY_CARE_PROVIDER_SITE_OTHER): Payer: 59 | Admitting: Sports Medicine

## 2015-01-23 VITALS — BP 125/77 | HR 83 | Temp 97.8°F | Ht 64.0 in | Wt 147.0 lb

## 2015-01-23 DIAGNOSIS — A6920 Lyme disease, unspecified: Secondary | ICD-10-CM | POA: Diagnosis not present

## 2015-01-23 MED ORDER — DOXYCYCLINE HYCLATE 100 MG PO TABS: 100.0000 mg | ORAL_TABLET | Freq: Two times a day (BID) | ORAL | Status: AC

## 2015-01-23 NOTE — Patient Instructions (Signed)
Lyme Disease You may have been bitten by a tick and are to watch for the development of Lyme Disease. Lyme Disease is an infection that is caused by a bacteria The bacteria causing this disease is named Borreilia burgdorferi. If a tick is infected with this bacteria and then bites you, then Lyme Disease may occur. These ticks are carried by deer and rodents such as rabbits and mice and infest grassy as well as forested areas. Fortunately most tick bites do not cause Lyme Disease.  Lyme Disease is easier to prevent than to treat. First, covering your legs with clothing when walking in areas where ticks are possibly abundant will prevent their attachment because ticks tend to stay within inches of the ground. Second, using insecticides containing DEET can be applied on skin or clothing. Last, because it takes about 12 to 24 hours for the tick to transmit the disease after attachment to the human host, you should inspect your body for ticks twice a day when you are in areas where Lyme Disease is common. You must look thoroughly when searching for ticks. The Ixodes tick that carries Lyme Disease is very small. It is around the size of a sesame seed (picture of tick is not actual size). Removal is best done by grasping the tick by the head and pulling it out. Do not to squeeze the body of the tick. This could inject the infecting bacteria into the bite site. Wash the area of the bite with an antiseptic solution after removal.  Lyme Disease is a disease that may affect many body systems. Because of the small size of the biting tick, most people do not notice being bitten. The first sign of an infection is usually a round red rash that extends out from the center of the tick bite. The center of the lesion may be blood colored (hemorrhagic) or have tiny blisters (vesicular). Most lesions have bright red outer borders and partial central clearing. This rash may extend out many inches in diameter, and multiple lesions may  be present. Other symptoms such as fatigue, headaches, chills and fever, general achiness and swelling of lymph glands may also occur. If this first stage of the disease is left untreated, these symptoms may gradually resolve by themselves, or progressive symptoms may occur because of spread of infection to other areas of the body.  Follow up with your caregiver to have testing and treatment if you have a tick bite and you develop any of the above complaints. Your caregiver may recommend preventative (prophylactic) medications which kill bacteria (antibiotics). Once a diagnosis of Lyme Disease is made, antibiotic treatment is highly likely to cure the disease. Effective treatment of late stage Lyme Disease may require longer courses of antibiotic therapy.  MAKE SURE YOU:   Understand these instructions.  Will watch your condition.  Will get help right away if you are not doing well or get worse. Document Released: 01/06/2001 Document Revised: 12/23/2011 Document Reviewed: 03/10/2009 [employer] Patient Information 2015 [employer]. This information is not intended to replace advice given to you by your health care provider. Make sure you discuss any questions you have with your health care provider.  

## 2015-01-23 NOTE — Assessment & Plan Note (Signed)
Tick bite on Saturday morning, discovery and removal later in the evening. Unfortunately she has developed an enlarging, tender bull's-eye-type rash with central clearing. This is suspicious for erythema chronicum migrans. I'm going to do tickborne titers, and we are going to treat with doxycycline for 14 days. Return to see me in 2 weeks.

## 2015-01-23 NOTE — Progress Notes (Signed)
  Subjective:    CC: tick bite  HPI: This is a pleasant 44 year old female, unfortunately she was probably bitten by a tick on Saturday morning, and did not note it until later that evening. The tick was removed in its entirety however she has now developed a rash with pain and itching, and a circular-shaped rash at the site of the issue, this is in the posterior aspect of her right knee. Symptoms are moderate, persistent without radiation.  Past medical history, Surgical history, Family history not pertinant except as noted below, Social history, Allergies, and medications have been entered into the medical record, reviewed, and no changes needed.   Review of Systems: No fevers, chills, night sweats, weight loss, chest pain, or shortness of breath.   Objective:    General: Well Developed, well nourished, and in no acute distress.  Neuro: Alert and oriented x3, extra-ocular muscles intact, sensation grossly intact.  HEENT: Normocephalic, atraumatic, pupils equal round reactive to light, neck supple, no masses, no lymphadenopathy, thyroid nonpalpable.  Skin: Warm and dry, there is a circular, 4 cm rash with central clearing, erythematous, indurated in the posterior aspect of her right knee. Cardiac: Regular rate and rhythm, no murmurs rubs or gallops, no lower extremity edema.  Respiratory: Clear to auscultation bilaterally. Not using accessory muscles, speaking in full sentences.  Impression and Recommendations:

## 2015-01-24 LAB — COMPREHENSIVE METABOLIC PANEL
ALT: 11 U/L (ref 0–35)
CO2: 25 mEq/L (ref 19–32)
Calcium: 9.2 mg/dL (ref 8.4–10.5)
Chloride: 102 mEq/L (ref 96–112)
Creat: 0.79 mg/dL (ref 0.50–1.10)
Glucose, Bld: 80 mg/dL (ref 70–99)
Sodium: 138 mEq/L (ref 135–145)
Total Protein: 7.1 g/dL (ref 6.0–8.3)

## 2015-01-24 LAB — CBC WITH DIFFERENTIAL/PLATELET
Basophils Absolute: 0.1 10*3/uL (ref 0.0–0.1)
Basophils Relative: 1 % (ref 0–1)
Eosinophils Absolute: 0.1 10*3/uL (ref 0.0–0.7)
Eosinophils Relative: 2 % (ref 0–5)
HCT: 40.3 % (ref 36.0–46.0)
Hemoglobin: 13.9 g/dL (ref 12.0–15.0)
Lymphocytes Relative: 32 % (ref 12–46)
Lymphs Abs: 2 10*3/uL (ref 0.7–4.0)
MCH: 28.8 pg (ref 26.0–34.0)
MCHC: 34.5 g/dL (ref 30.0–36.0)
MCV: 83.4 fL (ref 78.0–100.0)
MPV: 9.5 fL (ref 8.6–12.4)
Monocytes Absolute: 0.5 10*3/uL (ref 0.1–1.0)
Monocytes Relative: 8 % (ref 3–12)
Neutro Abs: 3.5 K/uL (ref 1.7–7.7)
Neutrophils Relative %: 57 % (ref 43–77)
Platelets: 265 10*3/uL (ref 150–400)
RBC: 4.83 MIL/uL (ref 3.87–5.11)
RDW: 13.2 % (ref 11.5–15.5)
WBC: 6.2 K/uL (ref 4.0–10.5)

## 2015-01-24 LAB — COMPREHENSIVE METABOLIC PANEL WITH GFR
AST: 16 U/L (ref 0–37)
Albumin: 4.8 g/dL (ref 3.5–5.2)
Alkaline Phosphatase: 51 U/L (ref 39–117)
BUN: 11 mg/dL (ref 6–23)
Potassium: 3.8 meq/L (ref 3.5–5.3)
Total Bilirubin: 0.8 mg/dL (ref 0.2–1.2)

## 2015-01-25 LAB — ROCKY MTN SPOTTED FVR ABS PNL(IGG+IGM)
RMSF IgG: 0.13 IV
RMSF IgM: 0.47 IV

## 2015-01-25 LAB — B. BURGDORFI ANTIBODIES: B burgdorferi Ab IgG+IgM: 0.11 {ISR}

## 2015-01-26 LAB — EHRLICHIA ANTIBODY PANEL
E chaffeensis (HGE) Ab, IgG: <1:64 {titer}
E chaffeensis (HGE) Ab, IgM: <1:20 {titer}

## 2015-02-06 ENCOUNTER — Ambulatory Visit: Payer: 59 | Admitting: Sports Medicine

## 2015-02-17 ENCOUNTER — Ambulatory Visit (INDEPENDENT_AMBULATORY_CARE_PROVIDER_SITE_OTHER): Payer: 59 | Admitting: Sports Medicine

## 2015-02-17 ENCOUNTER — Encounter: Payer: Self-pay | Admitting: Sports Medicine

## 2015-02-17 VITALS — BP 116/74 | HR 89 | Ht 64.0 in | Wt 139.0 lb

## 2015-02-17 DIAGNOSIS — E663 Overweight: Secondary | ICD-10-CM | POA: Diagnosis not present

## 2015-02-17 MED ORDER — PHENTERMINE HCL 37.5 MG PO TABS: ORAL_TABLET | ORAL | Status: DC

## 2015-02-17 NOTE — Progress Notes (Signed)
  Subjective:    CC: Weight check  HPI: Patient seen by nurse, no adverse effects, no concerns. She has lost weight.  Past medical history, Surgical history, Family history not pertinant except as noted below, Social history, Allergies, and medications have been entered into the medical record, reviewed, and no changes needed.   Review of Systems: No fevers, chills, night sweats, weight loss, chest pain, or shortness of breath.   Objective:    General: Well Developed, well nourished, and in no acute distress.   Impression and Recommendations:

## 2015-02-17 NOTE — Assessment & Plan Note (Signed)
9 pound weight loss, refilling phentermine, return in one month.

## 2015-03-20 ENCOUNTER — Ambulatory Visit (INDEPENDENT_AMBULATORY_CARE_PROVIDER_SITE_OTHER): Payer: 59 | Admitting: Sports Medicine

## 2015-03-20 ENCOUNTER — Encounter: Payer: Self-pay | Admitting: Sports Medicine

## 2015-03-20 VITALS — BP 138/91 | HR 98 | Ht 64.0 in | Wt 132.0 lb

## 2015-03-20 DIAGNOSIS — E663 Overweight: Secondary | ICD-10-CM

## 2015-03-20 DIAGNOSIS — M6208 Separation of muscle (nontraumatic), other site: Secondary | ICD-10-CM | POA: Insufficient documentation

## 2015-03-20 MED ORDER — PHENTERMINE HCL 37.5 MG PO TABS
ORAL_TABLET | ORAL | Status: DC
Start: 2015-03-20 — End: 2015-04-21

## 2015-03-20 NOTE — Assessment & Plan Note (Signed)
Assuring continues to lose weight I have encouraged her to work extensively on abdominal isometrics, as well as obliques, and spine extensors for balance. This will also help improve the appearance of the diastasis.

## 2015-03-20 NOTE — Progress Notes (Signed)
  Subjective:    CC: Weight check  HPI:  Ana [name] returns, after her second month on phentermine she has lost additional weight and is nearing her 125 pound targeted. She is happy with the results so far, she is more energetic and her headaches have disappeared.  Unfortunately she does have a complaint of a visible and palpable fullness on the right side of her lower abdomen, she is post Pfannenstiel incision times multiple episodes both for cesarean sections and hysterectomy. She has always avoided working her abs in the gym. Only minimal tenderness.  Past medical history, Surgical history, Family history not pertinant except as noted below, Social history, Allergies, and medications have been entered into the medical record, reviewed, and no changes needed.   Review of Systems: No fevers, chills, night sweats, weight loss, chest pain, or shortness of breath.   Objective:    General: Well Developed, well nourished, and in no acute distress.  Neuro: Alert and oriented x3, extra-ocular muscles intact, sensation grossly intact.  HEENT: Normocephalic, atraumatic, pupils equal round reactive to light, neck supple, no masses, no lymphadenopathy, thyroid nonpalpable.  Skin: Warm and dry, no rashes. Cardiac: Regular rate and rhythm, no murmurs rubs or gallops, no lower extremity edema.  Respiratory: Clear to auscultation bilaterally. Not using accessory muscles, speaking in full sentences. Abdomen: Soft, nontender, nondistended, there is a visible right-sided lower abdominal fullness, it is not tender.  Unofficial ultrasound shows a fairly lateral positioning of the inferior rectus abdominis suggestive of diastases recti.  Impression and Recommendations:

## 2015-03-20 NOTE — Assessment & Plan Note (Signed)
Excellent continued weight loss, we are now approaching the target weight of 125 pounds. Refilling phentermine as we enter the third month, we will probably overshoot the target, and then drop down to a half dose for longer-term treatment.

## 2015-04-21 ENCOUNTER — Encounter: Payer: Self-pay | Admitting: Sports Medicine

## 2015-04-21 ENCOUNTER — Ambulatory Visit (INDEPENDENT_AMBULATORY_CARE_PROVIDER_SITE_OTHER): Payer: 59 | Admitting: Sports Medicine

## 2015-04-21 VITALS — BP 114/70 | HR 99 | Ht 64.0 in | Wt 128.0 lb

## 2015-04-21 DIAGNOSIS — E663 Overweight: Secondary | ICD-10-CM | POA: Diagnosis not present

## 2015-04-21 MED ORDER — PHENTERMINE HCL 37.5 MG PO TABS: ORAL_TABLET | ORAL | Status: DC

## 2015-04-21 NOTE — Assessment & Plan Note (Signed)
Extremely well as we entered the fourth month, 3 pounds from her goal weight. Refilling medication, we are going to switch to one half tab twice a day. Return in one month.

## 2015-04-21 NOTE — Progress Notes (Signed)
  Subjective:    CC: Weight check  HPI: Miranda returns, she is done extremely well after the last 3 months of phentermine, she is approximately 3 pounds from her goal weight of 125 pounds. She has not yet done her diastases recti or core rehabilitation exercises.  Past medical history, Surgical history, Family history not pertinant except as noted below, Social history, Allergies, and medications have been entered into the medical record, reviewed, and no changes needed.   Review of Systems: No fevers, chills, night sweats, weight loss, chest pain, or shortness of breath.   Objective:    General: Well Developed, well nourished, and in no acute distress.  Neuro: Alert and oriented x3, extra-ocular muscles intact, sensation grossly intact.  HEENT: Normocephalic, atraumatic, pupils equal round reactive to light, neck supple, no masses, no lymphadenopathy, thyroid nonpalpable.  Skin: Warm and dry, no rashes. Cardiac: Regular rate and rhythm, no murmurs rubs or gallops, no lower extremity edema.  Respiratory: Clear to auscultation bilaterally. Not using accessory muscles, speaking in full sentences.  Impression and Recommendations:

## 2015-05-22 ENCOUNTER — Ambulatory Visit (INDEPENDENT_AMBULATORY_CARE_PROVIDER_SITE_OTHER): Payer: 59 | Admitting: Sports Medicine

## 2015-05-22 ENCOUNTER — Encounter: Payer: Self-pay | Admitting: Sports Medicine

## 2015-05-22 VITALS — BP 121/77 | HR 87 | Ht 64.0 in | Wt 125.0 lb

## 2015-05-22 DIAGNOSIS — M25511 Pain in right shoulder: Secondary | ICD-10-CM

## 2015-05-22 DIAGNOSIS — M6208 Separation of muscle (nontraumatic), other site: Secondary | ICD-10-CM | POA: Diagnosis not present

## 2015-05-22 DIAGNOSIS — E663 Overweight: Secondary | ICD-10-CM | POA: Diagnosis not present

## 2015-05-22 MED ORDER — PHENTERMINE HCL 37.5 MG PO TABS: ORAL_TABLET | ORAL | Status: DC

## 2015-05-22 NOTE — Assessment & Plan Note (Signed)
Rehab exercises given. 

## 2015-05-22 NOTE — Assessment & Plan Note (Signed)
Fantastic results so far, continue one half tab twice a day for a single additional month, we are now at goal weight. She is going to continue to be aggressive with resistance training.  Return in one month. Switching to one half tab daily in one month

## 2015-05-22 NOTE — Progress Notes (Signed)
  Subjective:    CC: Follow-up  HPI: Kevin returns, she is down over 40 pounds from her starting weight, she is currently at her target weight, he agreed to overshoot her target weight of bit with a single additional month of phentermine before dropping to a half tab, she is also getting back into resistance exercises. She is happy with how things are going, and happy with her body right now with the exception of a bit of diastases recti.  Right shoulder pain: Localized over the deltoid, worse with abduction, she is really not been any rehabilitation exercises, have not imaged this nor have we done any interventional type treatments. Pain is moderate, persistent. This has been the single worst thing preventing her from getting more aggressive with resistance training.  Past medical history, Surgical history, Family history not pertinant except as noted below, Social history, Allergies, and medications have been entered into the medical record, reviewed, and no changes needed.   Review of Systems: No fevers, chills, night sweats, weight loss, chest pain, or shortness of breath.   Objective:    General: Well Developed, well nourished, and in no acute distress.  Neuro: Alert and oriented x3, extra-ocular muscles intact, sensation grossly intact.  HEENT: Normocephalic, atraumatic, pupils equal round reactive to light, neck supple, no masses, no lymphadenopathy, thyroid nonpalpable.  Skin: Warm and dry, no rashes. Cardiac: Regular rate and rhythm, no murmurs rubs or gallops, no lower extremity edema.  Respiratory: Clear to auscultation bilaterally. Not using accessory muscles, speaking in full sentences.  Impression and Recommendations:    I spent 25 minutes with this patient, greater than 50% was face-to-face time counseling regarding the above diagnoses

## 2015-05-22 NOTE — Assessment & Plan Note (Signed)
There are components of both glenohumeral and impingement related pain. She will do rotator cuff rehabilitation exercises at home before we consider anything interventional.

## 2015-06-22 ENCOUNTER — Ambulatory Visit (INDEPENDENT_AMBULATORY_CARE_PROVIDER_SITE_OTHER): Payer: 59 | Admitting: Sports Medicine

## 2015-06-22 ENCOUNTER — Encounter: Payer: Self-pay | Admitting: Sports Medicine

## 2015-06-22 VITALS — BP 102/70 | HR 94 | Ht 64.0 in | Wt 123.0 lb

## 2015-06-22 DIAGNOSIS — E663 Overweight: Secondary | ICD-10-CM

## 2015-06-22 DIAGNOSIS — M25511 Pain in right shoulder: Secondary | ICD-10-CM | POA: Diagnosis not present

## 2015-06-22 MED ORDER — PHENTERMINE HCL 37.5 MG PO TABS: 18.7500 mg | ORAL_TABLET | Freq: Every day | ORAL | Status: DC

## 2015-06-22 NOTE — Assessment & Plan Note (Signed)
Fantastic response , 40 pound weight loss from the starting weight, we have finished 6 months of phentermine and we are going to draw to a half tab daily, three-month supply given, return in 3 months.

## 2015-06-22 NOTE — Assessment & Plan Note (Signed)
Impingement type pain, needs to get more compliant with rehabilitation exercises.   Come see me on an as-needed basis however if she has a flare we can do a subacromial injection.

## 2015-06-22 NOTE — Progress Notes (Signed)
  Subjective:    CC:  Weight check  HPI:  Kharisma returns, she's lost over 40 pounds so far, we have done 6 months of full strength phentermine and she is very happy with the results so far.   Right shoulder pain: Localized over the deltoid, worse with overhead activities, moderate, persistent, has been minimally compliant with rehabilitation exercises but is not yet ready to consider interventional treatment.  Past medical history, Surgical history, Family history not pertinant except as noted below, Social history, Allergies, and medications have been entered into the medical record, reviewed, and no changes needed.   Review of Systems: No fevers, chills, night sweats, weight loss, chest pain, or shortness of breath.   Objective:    General: Well Developed, well nourished, and in no acute distress.  Neuro: Alert and oriented x3, extra-ocular muscles intact, sensation grossly intact.  HEENT: Normocephalic, atraumatic, pupils equal round reactive to light, neck supple, no masses, no lymphadenopathy, thyroid nonpalpable.  Skin: Warm and dry, no rashes. Cardiac: Regular rate and rhythm, no murmurs rubs or gallops, no lower extremity edema.  Respiratory: Clear to auscultation bilaterally. Not using accessory muscles, speaking in full sentences.  Impression and Recommendations:

## 2015-07-05 ENCOUNTER — Telehealth: Payer: Self-pay

## 2015-07-05 DIAGNOSIS — G4700 Insomnia, unspecified: Secondary | ICD-10-CM

## 2015-07-05 MED ORDER — ZOLPIDEM TARTRATE 10 MG PO TABS: 10.0000 mg | ORAL_TABLET | Freq: Every evening | ORAL | Status: DC | PRN

## 2015-07-05 NOTE — Telephone Encounter (Signed)
PATIENT REQUEST REFILL FOR AMBIEN 10 MG. # 30 3 REFILLS WERE FAXED TO [facility]. PER PCP HE WILL REFILL FOR A YEAR. Kahlie Deutscher,CMA

## 2015-07-19 ENCOUNTER — Encounter: Payer: Self-pay | Admitting: Sports Medicine

## 2015-07-19 ENCOUNTER — Ambulatory Visit (INDEPENDENT_AMBULATORY_CARE_PROVIDER_SITE_OTHER): Payer: 59 | Admitting: Sports Medicine

## 2015-07-19 VITALS — BP 118/74 | HR 86 | Ht 64.0 in | Wt 128.0 lb

## 2015-07-19 DIAGNOSIS — Z8669 Personal history of other diseases of the nervous system and sense organs: Secondary | ICD-10-CM

## 2015-07-19 MED ORDER — KETOROLAC TROMETHAMINE 30 MG/ML IJ SOLN
30.0000 mg | Freq: Once | INTRAMUSCULAR | Status: AC
  Administered 2015-07-19: 30 mg via INTRAMUSCULAR

## 2015-07-19 MED ORDER — AZITHROMYCIN 250 MG PO TABS
ORAL_TABLET | ORAL | Status: DC
Start: 2015-07-19 — End: 2015-08-23

## 2015-07-19 MED ORDER — FLUTICASONE PROPIONATE 50 MCG/ACT NA SUSP: NASAL | Status: DC

## 2015-07-19 MED ORDER — DEXAMETHASONE SODIUM PHOSPHATE 10 MG/ML IJ SOLN
4.0000 mg | Freq: Once | INTRAMUSCULAR | Status: AC
  Administered 2015-07-19: 4 mg via INTRAMUSCULAR

## 2015-07-19 NOTE — Progress Notes (Signed)
  Subjective:    CC: headache  HPI: Ana [name] comes in with a severe headache, not the worst of her life, bitemporal, frontal, present for 10 days now with nasal discharge, but also with phonophobia, photophobia, and nausea. No constitutional symptoms, neck stiffness, no trauma.  Past medical history, Surgical history, Family history not pertinant except as noted below, Social history, Allergies, and medications have been entered into the medical record, reviewed, and no changes needed.   Review of Systems: No fevers, chills, night sweats, weight loss, chest pain, or shortness of breath.   Objective:    General: Well Developed, well nourished, and in no acute distress.  Neuro: Alert and oriented x3, extra-ocular muscles intact, sensation grossly intact. Cranial nerves II through XII are intact, motor, sensory, and coordinative functions are all intact. HEENT: Normocephalic, atraumatic, pupils equal round reactive to light, neck supple, no masses, no lymphadenopathy, thyroid nonpalpable. Oropharynx, ear canals are markable, nasopharynx shows boggy and erythematous turbinates with significant mucus, there is tenderness to palpation and percussion over the frontal and maxillary sinuses. Skin: Warm and dry, no rashes. Cardiac: Regular rate and rhythm, no murmurs rubs or gallops, no lower extremity edema.  Respiratory: Clear to auscultation bilaterally. Not using accessory muscles, speaking in full sentences.  Toradol 30 mg intramuscular, Decadron 4 mg intramuscular.  Impression and Recommendations:    I spent 40 minutes with this patient, greater than 50% was face-to-face time counseling regarding the above diagnoses

## 2015-07-19 NOTE — Assessment & Plan Note (Signed)
Combination of maxillary sinusitis which has triggered a migraine. Toradol 30, dexamethasone 4, azithromycin, Flonase. Return to see me if no better in one week.

## 2015-07-20 ENCOUNTER — Ambulatory Visit: Payer: 59 | Admitting: Sports Medicine

## 2015-07-21 ENCOUNTER — Ambulatory Visit (INDEPENDENT_AMBULATORY_CARE_PROVIDER_SITE_OTHER): Payer: 59

## 2015-07-21 ENCOUNTER — Ambulatory Visit (INDEPENDENT_AMBULATORY_CARE_PROVIDER_SITE_OTHER): Payer: 59 | Admitting: Sports Medicine

## 2015-07-21 ENCOUNTER — Encounter: Payer: Self-pay | Admitting: Sports Medicine

## 2015-07-21 ENCOUNTER — Other Ambulatory Visit: Payer: Self-pay | Admitting: Sports Medicine

## 2015-07-21 DIAGNOSIS — Z8669 Personal history of other diseases of the nervous system and sense organs: Secondary | ICD-10-CM

## 2015-07-21 DIAGNOSIS — R51 Headache: Secondary | ICD-10-CM

## 2015-07-21 DIAGNOSIS — R110 Nausea: Secondary | ICD-10-CM

## 2015-07-21 DIAGNOSIS — J3489 Other specified disorders of nose and nasal sinuses: Secondary | ICD-10-CM

## 2015-07-21 MED ORDER — PREDNISONE 50 MG PO TABS: 50.0000 mg | ORAL_TABLET | Freq: Every day | ORAL | Status: DC

## 2015-07-21 MED ORDER — KETOROLAC TROMETHAMINE 30 MG/ML IJ SOLN
30.0000 mg | Freq: Once | INTRAMUSCULAR | Status: AC
  Administered 2015-07-21: 30 mg via INTRAVENOUS

## 2015-07-21 MED ORDER — HYDROCODONE-ACETAMINOPHEN 5-325 MG PO TABS: 1.0000 | ORAL_TABLET | Freq: Three times a day (TID) | ORAL | Status: DC | PRN

## 2015-07-21 MED ORDER — IOHEXOL 300 MG/ML  SOLN
75.0000 mL | Freq: Once | INTRAMUSCULAR | Status: AC | PRN
  Administered 2015-07-21: 75 mL via INTRAVENOUS

## 2015-07-21 NOTE — Progress Notes (Signed)
  Subjective:    CC: Headache  HPI: I saw Ana [name] 2 days ago, she had signs and symptoms that were consistent with both acute maxillary sinusitis and migraine, one daily treated her for both and she improved, but unfortunately had a recurrence of pain with swelling over the nasal bridge, headache, photophobia, no constitutional symptoms, and severe fatigue. Headache today is one of the worst she's ever had. No numbness or tingling, no difficulty walking, speaking, moving.  Past medical history, Surgical history, Family history not pertinant except as noted below, Social history, Allergies, and medications have been entered into the medical record, reviewed, and no changes needed.   Review of Systems: No fevers, chills, night sweats, weight loss, chest pain, or shortness of breath.   Objective:    General: Well Developed, well nourished, and in no acute distress.  Neuro: Alert and oriented x3, extra-ocular muscles intact, sensation grossly intact. Cranial nerves II through XII are intact, motor, sensory, and coordinative functions are all intact. HEENT: Normocephalic, atraumatic, pupils equal round reactive to light, neck supple, no masses, no lymphadenopathy, thyroid nonpalpable. Oropharynx and ear canals are unremarkable, there is visible swelling over the nasal bridge, and boggy erythematous nasal turbinates. Skin: Warm and dry, no rashes. Cardiac: Regular rate and rhythm, no murmurs rubs or gallops, no lower extremity edema.  Respiratory: Clear to auscultation bilaterally. Not using accessory muscles, speaking in full sentences.  CT of the head and maxillary sinuses is essentially negative.  Impression and Recommendations:

## 2015-07-21 NOTE — Assessment & Plan Note (Addendum)
Severe intractable headache with pain and swelling over the nasal bridge. This was relieved temporarily with Toradol injection,another injection given today. Considering severely of the headache, and swelling across the nasal bridge we are going to give CT with IV contrast stat, and I'm going to give her some hydrocodone. She will continue her azithromycin and Flonase.  CT scan is negative, feeling better with Toradol, adding prednisone to the regimen.

## 2015-07-24 ENCOUNTER — Telehealth: Payer: Self-pay

## 2015-07-24 DIAGNOSIS — J320 Chronic maxillary sinusitis: Secondary | ICD-10-CM

## 2015-07-24 DIAGNOSIS — J329 Chronic sinusitis, unspecified: Secondary | ICD-10-CM | POA: Insufficient documentation

## 2015-07-24 NOTE — Telephone Encounter (Signed)
Patient called request a referral for ENT she stated that she is still having nasal drainage and that she  has a pretty bad weekend from it. Please advise. Mattix Imhof,CMA

## 2015-07-24 NOTE — Telephone Encounter (Signed)
Referral placed.

## 2015-07-25 NOTE — Telephone Encounter (Signed)
Patient  Has been informed. Dameka Younker,CMA

## 2015-08-15 ENCOUNTER — Telehealth: Payer: Self-pay

## 2015-08-15 DIAGNOSIS — Z8669 Personal history of other diseases of the nervous system and sense organs: Secondary | ICD-10-CM

## 2015-08-15 NOTE — Telephone Encounter (Signed)
Referral placed.

## 2015-08-15 NOTE — Telephone Encounter (Signed)
PATIENT CALLED STAATED THAT SHE IS STILL HAVING HEADACHES, SHE REQUEST A REFERRAL FOR NEUROLOGIST. Ana [name],CMA

## 2015-08-16 NOTE — Telephone Encounter (Signed)
Left message on patient home vm advising that referral has been placed. Lowen Mansouri,CMA

## 2015-08-23 ENCOUNTER — Encounter: Payer: Self-pay | Admitting: Neurology

## 2015-08-23 ENCOUNTER — Ambulatory Visit (INDEPENDENT_AMBULATORY_CARE_PROVIDER_SITE_OTHER): Payer: 59 | Admitting: Neurology

## 2015-08-23 VITALS — BP 129/73 | HR 95 | Ht 64.0 in | Wt 126.0 lb

## 2015-08-23 DIAGNOSIS — G441 Vascular headache, not elsewhere classified: Secondary | ICD-10-CM

## 2015-08-23 DIAGNOSIS — R51 Headache: Secondary | ICD-10-CM

## 2015-08-23 DIAGNOSIS — R519 Headache, unspecified: Secondary | ICD-10-CM

## 2015-08-23 HISTORY — DX: Headache, unspecified: R51.9

## 2015-08-23 MED ORDER — TOPIRAMATE 25 MG PO TABS: ORAL_TABLET | ORAL | Status: DC

## 2015-08-23 NOTE — Progress Notes (Signed)
Reason for visit: Headache  Referring physician: Dr. Carlota Raspberry is a 44 y.o. female  History of present illness:  Ms. Kosta is a 44 year old right-handed white female with a history of headaches that have been occurring off and on throughout her adult life. She has always attributed these headaches to sinus issues as they would occur along the frontal aspects of the head associated with a pressure sensation. The patient has never actually had sinus issues documented, but she has always been treated for sinus problems with steroids and antibiotics with resolution of her headaches. These events may occur once every 2 months or so, lasting up to 2 weeks. The patient is not incapacitated with these headaches. The patient had onset of her usual headache about one month ago, with pressure sensation in the frontal areas, but the patient then developed some sensations as well in the nose and in the maxillary sinus areas with a pressure sensation, and tenderness across the bridge of nose. This culminated with significant swelling across the nose and cheek areas, and some redness and flushing in these regions as well. The patient was placed on prednisone, and she was placed on antibiotic therapy. A short course of steroids did not treat the headache and swelling, and eventually she went to see an ear nose and throat doctor. The patient was sent for blood work, and she was placed on a higher dose prednisone Dosepak which seemed to help the swelling and the headache. The patient is now left with the baseline pressure sensation in the nose and frontal areas. The patient did report some slight dizziness, with no nausea or vomiting. The patient denied any visual disturbances. She had some photophobia, slight phonophobia. The patient denied any numbness or weakness on the face, arms, or legs. The patient has undergone a CT scan of the brain that did not show sinus disease. She is sent to this  office for an evaluation. She indicates that the blood work looked for various connective tissue disorders, the results of the blood work are not available to me. The patient reports no fevers, chills, weight loss, or malaise.  Past Medical History  Diagnosis Date  . Seasonal allergies   . Asthma     intermittent  . GERD (gastroesophageal reflux disease)     controlled with diet  . Migraine headache     about 2 per yr  . Borderline hyperlipidemia     No med trials in the past  . Fibroids   . Headache 08/23/2015    Past Surgical History  Procedure Laterality Date  . Cesarean section  2005, 2007    x2  . Myomectomy    . Dilation and curettage of uterus      missed ab  . Tonsillectomy    . Knee arthroscopy    . Wisdom tooth extraction    . Abdominal hysterectomy  08/31/2012    Procedure: HYSTERECTOMY ABDOMINAL;  Surgeon: Luz Lex, MD;  Location: [facility];  Service: Gynecology;  Laterality: N/A;    Family History  Problem Relation Age of Onset  . Arthritis Mother   . Hypertension Mother   . Hyperlipidemia Father   . Hypertension Father   . Cancer Paternal Grandmother     breast  . Arthritis Maternal Grandmother   . Heart disease Paternal Grandfather   . Migraines Neg Hx     Social history:  reports that she has never smoked. She has never used smokeless tobacco.  She reports that she drinks alcohol. She reports that she does not use illicit drugs.  Medications:  Prior to Admission medications   Medication Sig Start Date End Date Taking? Authorizing Provider  azithromycin (ZITHROMAX Z-PAK) 250 MG tablet Take 2 tablets (500 mg) on  Day 1,  followed by 1 tablet (250 mg) once daily on Days 2 through 5. 07/19/15   Silverio Decamp, MD  cetirizine (ZYRTEC) 10 MG tablet Take 10 mg by mouth daily.    Historical Provider, MD  fluticasone (FLONASE) 50 MCG/ACT nasal spray One spray in each nostril twice a day, use left hand for right nostril, and right hand for left nostril.  07/19/15   Silverio Decamp, MD  HYDROcodone-acetaminophen (NORCO/VICODIN) 5-325 MG tablet Take 1 tablet by mouth every 8 (eight) hours as needed for moderate pain. 07/21/15   Silverio Decamp, MD  ibuprofen (ADVIL,MOTRIN) 600 MG tablet Take 1 tablet (600 mg total) by mouth every 6 (six) hours as needed (mild pain). 09/02/12   Louretta Shorten, MD  phentermine (ADIPEX-P) 37.5 MG tablet Take 0.5 tablets (18.75 mg total) by mouth daily before breakfast. 06/22/15   Silverio Decamp, MD  predniSONE (DELTASONE) 50 MG tablet Take 1 tablet (50 mg total) by mouth daily. 07/21/15   Silverio Decamp, MD  zolpidem (AMBIEN) 10 MG tablet Take 1 tablet (10 mg total) by mouth at bedtime as needed for sleep. 07/05/15   Silverio Decamp, MD      Allergies  Allergen Reactions  . Omnicef [Cefdinir] Other (See Comments)    GI upset  . Trazodone And Nefazodone Nausea And Vomiting    ROS:  Out of a complete 14 system review of symptoms, the patient complains only of the following symptoms, and all other reviewed systems are negative.  Fatigue, dizziness Headache Insomnia  Blood pressure 129/73, pulse 95, height 5\' 4"  (1.626 m), weight 126 lb (57.153 kg), last menstrual period 07/30/2012.  Physical Exam  General: The patient is alert and cooperative at the time of the examination.  Eyes: Pupils are equal, round, and reactive to light. Discs are flat bilaterally.  Neck: The neck is supple, no carotid bruits are noted.  Respiratory: The respiratory examination is clear.  Cardiovascular: The cardiovascular examination reveals a regular rate and rhythm, no obvious murmurs or rubs are noted.  Skin: Extremities are without significant edema.  Neurologic Exam  Mental status: The patient is alert and oriented x 3 at the time of the examination. The patient has apparent normal recent and remote memory, with an apparently normal attention span and concentration ability.  Cranial nerves:  Facial symmetry is present. There is good sensation of the face to pinprick and soft touch bilaterally. The strength of the facial muscles and the muscles to head turning and shoulder shrug are normal bilaterally. Speech is well enunciated, no aphasia or dysarthria is noted. Extraocular movements are full. Visual fields are full. The tongue is midline, and the patient has symmetric elevation of the soft palate. No obvious hearing deficits are noted.  Motor: The motor testing reveals 5 over 5 strength of all 4 extremities. Good symmetric motor tone is noted throughout.  Sensory: Sensory testing is intact to pinprick, soft touch, vibration sensation, and position sense on all 4 extremities. No evidence of extinction is noted.  Coordination: Cerebellar testing reveals good finger-nose-finger and heel-to-shin bilaterally.  Gait and station: Gait is normal. Tandem gait is normal. Romberg is negative. No drift is seen.  Reflexes: Deep  tendon reflexes are symmetric and normal bilaterally. Toes are downgoing bilaterally.   CT head 07/21/15:   IMPRESSION: 1. Normal CT appearance of the brain. 2. Visualized paranasal sinuses are normal.  * CT scan images were reviewed online. I agree with the written report.   Assessment/Plan:  1. Episodic headache  2. Facial and nasal swelling  The etiology of the recent event is not clear. The patient has apparently undergone some blood work, I will try to get the report of this. The patient will be placed on Topamax, many of her past headaches likely represent migraine. The patient will undergo MRI evaluation of the brain with and without gadolinium enhancement. The patient will follow-up in 3 months, sooner if needed. Further blood work may be required in the future. The patient has been off of prednisone since 08/05/2015.  Jill Alexanders MD 08/23/2015 7:16 PM  [facility] [address] [place], [place] [address]  Phone  [phone] Fax [phone]

## 2015-08-23 NOTE — Patient Instructions (Signed)
   We will check MRI of the brain and start Topamax for the headache.  Migraine Headache A migraine headache is an intense, throbbing pain on one or both sides of your head. A migraine can last for 30 minutes to several hours. CAUSES  The exact cause of a migraine headache is not always known. However, a migraine may be caused when nerves in the brain become irritated and release chemicals that cause inflammation. This causes pain. Certain things may also trigger migraines, such as:  Alcohol.  Smoking.  Stress.  Menstruation.  Aged cheeses.  Foods or drinks that contain nitrates, glutamate, aspartame, or tyramine.  Lack of sleep.  Chocolate.  Caffeine.  Hunger.  Physical exertion.  Fatigue.  Medicines used to treat chest pain (nitroglycerine), birth control pills, estrogen, and some blood pressure medicines. SIGNS AND SYMPTOMS  Pain on one or both sides of your head.  Pulsating or throbbing pain.  Severe pain that prevents daily activities.  Pain that is aggravated by any physical activity.  Nausea, vomiting, or both.  Dizziness.  Pain with exposure to bright lights, loud noises, or activity.  General sensitivity to bright lights, loud noises, or smells. Before you get a migraine, you may get warning signs that a migraine is coming (aura). An aura may include:  Seeing flashing lights.  Seeing bright spots, halos, or zigzag lines.  Having tunnel vision or blurred vision.  Having feelings of numbness or tingling.  Having trouble talking.  Having muscle weakness. DIAGNOSIS  A migraine headache is often diagnosed based on:  Symptoms.  Physical exam.  A CT scan or MRI of your head. These imaging tests cannot diagnose migraines, but they can help rule out other causes of headaches. TREATMENT Medicines may be given for pain and nausea. Medicines can also be given to help prevent recurrent migraines.  HOME CARE INSTRUCTIONS  Only take  over-the-counter or prescription medicines for pain or discomfort as directed by your health care provider. The use of long-term narcotics is not recommended.  Lie down in a dark, quiet room when you have a migraine.  Keep a journal to find out what may trigger your migraine headaches. For example, write down:  What you eat and drink.  How much sleep you get.  Any change to your diet or medicines.  Limit alcohol consumption.  Quit smoking if you smoke.  Get 7-9 hours of sleep, or as recommended by your health care provider.  Limit stress.  Keep lights dim if bright lights bother you and make your migraines worse. SEEK IMMEDIATE MEDICAL CARE IF:   Your migraine becomes severe.  You have a fever.  You have a stiff neck.  You have vision loss.  You have muscular weakness or loss of muscle control.  You start losing your balance or have trouble walking.  You feel faint or pass out.  You have severe symptoms that are different from your first symptoms. MAKE SURE YOU:   Understand these instructions.  Will watch your condition.  Will get help right away if you are not doing well or get worse.   This information is not intended to replace advice given to you by your health care provider. Make sure you discuss any questions you have with your health care provider.   Document Released: 09/30/2005 Document Revised: 10/21/2014 Document Reviewed: 06/07/2013 Elsevier Interactive Patient Education [employer].

## 2015-08-25 ENCOUNTER — Telehealth: Payer: Self-pay | Admitting: Neurology

## 2015-08-25 DIAGNOSIS — R519 Headache, unspecified: Secondary | ICD-10-CM

## 2015-08-25 DIAGNOSIS — R51 Headache: Principal | ICD-10-CM

## 2015-08-25 NOTE — Telephone Encounter (Signed)
I called the patient, talk with her husband. I have received the blood work from Dr. Benjamine Mola. The patient had a CBC which was normal with exception of a white blood count of 11.2, the patient was on steroids around this time. Sedimentation rate was 4. A comprehensive metabolic profile was unremarkable with exception of a minimal elevation in the SGOT of 30. ANA with reflex profile was done and was unremarkable. I will reorder some further blood work, the patient is to come in at her convenience to get the blood work done.

## 2015-09-05 ENCOUNTER — Other Ambulatory Visit (INDEPENDENT_AMBULATORY_CARE_PROVIDER_SITE_OTHER): Payer: Self-pay

## 2015-09-05 ENCOUNTER — Ambulatory Visit
Admission: RE | Admit: 2015-09-05 | Discharge: 2015-09-05 | Disposition: A | Discharge status: Home / Self Care | Payer: 59 | Source: Ambulatory Visit | Attending: Neurology | Admitting: Neurology

## 2015-09-05 DIAGNOSIS — G441 Vascular headache, not elsewhere classified: Secondary | ICD-10-CM | POA: Diagnosis not present

## 2015-09-05 DIAGNOSIS — R519 Headache, unspecified: Secondary | ICD-10-CM

## 2015-09-05 DIAGNOSIS — R51 Headache: Principal | ICD-10-CM

## 2015-09-05 DIAGNOSIS — Z0289 Encounter for other administrative examinations: Secondary | ICD-10-CM

## 2015-09-05 MED ORDER — GADOBENATE DIMEGLUMINE 529 MG/ML IV SOLN
11.0000 mL | Freq: Once | INTRAVENOUS | Status: AC | PRN
  Administered 2015-09-05: 11 mL via INTRAVENOUS

## 2015-09-06 ENCOUNTER — Telehealth: Payer: Self-pay | Admitting: Neurology

## 2015-09-06 LAB — C-REACTIVE PROTEIN

## 2015-09-06 LAB — PAN-ANCA
Atypical pANCA: <1:20 {titer}
Myeloperoxidase Ab: <9 U/mL (ref 0.0–9.0)
P-ANCA: <1:20 {titer}

## 2015-09-06 LAB — RHEUMATOID FACTOR: Rhuematoid fact SerPl-aCnc: <10 IU/mL (ref 0.0–13.9)

## 2015-09-06 LAB — SEDIMENTATION RATE: SED RATE: 2 mm/h (ref 0–32)

## 2015-09-06 LAB — B. BURGDORFI ANTIBODIES: Lyme IgG/IgM Ab: <0.91 {ISR} (ref 0.00–0.90)

## 2015-09-06 LAB — ANGIOTENSIN CONVERTING ENZYME: Angio Convert Enzyme: 69 U/L (ref 14–82)

## 2015-09-06 NOTE — Telephone Encounter (Signed)
  I called the patient. The MRI of the brain is normal. Most of the blood work is back, so far is normal.  MRI brain 09/06/15:  IMPRESSION: This is a normal MRI of the brain with and without contrast.

## 2015-09-11 ENCOUNTER — Telehealth: Payer: Self-pay

## 2015-09-11 NOTE — Telephone Encounter (Signed)
I called the patient and left a voicemail relaying results. 

## 2015-09-11 NOTE — Telephone Encounter (Signed)
-----   Message from Kathrynn Ducking, MD sent at 09/06/2015  5:13 PM EST -----  The blood work results are unremarkable. Please call the patient.  ----- Message -----    From: [facility] Lab Results In Interface    Sent: 09/06/2015   7:44 AM      To: Kathrynn Ducking, MD

## 2015-09-21 ENCOUNTER — Ambulatory Visit: Payer: 59 | Admitting: Sports Medicine

## 2015-09-22 ENCOUNTER — Encounter: Payer: Self-pay | Admitting: Sports Medicine

## 2015-09-22 ENCOUNTER — Ambulatory Visit (INDEPENDENT_AMBULATORY_CARE_PROVIDER_SITE_OTHER): Payer: 59 | Admitting: Sports Medicine

## 2015-09-22 VITALS — BP 106/71 | HR 82 | Temp 98.0°F | Resp 18 | Wt 127.4 lb

## 2015-09-22 DIAGNOSIS — E663 Overweight: Secondary | ICD-10-CM

## 2015-09-22 MED ORDER — PHENTERMINE HCL 37.5 MG PO TABS: ORAL_TABLET | ORAL | Status: DC

## 2015-09-22 NOTE — Assessment & Plan Note (Signed)
Overall good weight loss after an additional 3 months of half dose phentermine. She did put on a few more pounds, we are going to refill phentermine and she may start her Topamax. Return in 3 months.

## 2015-09-22 NOTE — Progress Notes (Signed)
  Subjective:    CC: Follow-up  HPI: Facial swelling: Ultimately CT scan was negative for sinus disease, we treated her aggressively with steroids, and symptoms improved. Her facial swelling did progress, she saw neurology and ENT, without a diagnosis. If this happens again she understands we will need to simply hit her hard with high-dose steroids.  Overweight: Good continued weight loss after 3 months of half dose phentermine, we have done 9 total months of phentermine, she has not yet started Topamax.  Past medical history, Surgical history, Family history not pertinant except as noted below, Social history, Allergies, and medications have been entered into the medical record, reviewed, and no changes needed.   Review of Systems: No fevers, chills, night sweats, weight loss, chest pain, or shortness of breath.   Objective:    General: Well Developed, well nourished, and in no acute distress.  Neuro: Alert and oriented x3, extra-ocular muscles intact, sensation grossly intact.  HEENT: Normocephalic, atraumatic, pupils equal round reactive to light, neck supple, no masses, no lymphadenopathy, thyroid nonpalpable.  Skin: Warm and dry, no rashes. Cardiac: Regular rate and rhythm, no murmurs rubs or gallops, no lower extremity edema.  Respiratory: Clear to auscultation bilaterally. Not using accessory muscles, speaking in full sentences.  Impression and Recommendations:    I spent 25 minutes with this patient, greater than 50% was face-to-face time counseling regarding the above diagnoses

## 2015-11-06 ENCOUNTER — Other Ambulatory Visit: Payer: Self-pay

## 2015-11-06 DIAGNOSIS — G4700 Insomnia, unspecified: Secondary | ICD-10-CM

## 2015-11-06 MED ORDER — ZOLPIDEM TARTRATE 10 MG PO TABS
10.0000 mg | ORAL_TABLET | Freq: Every evening | ORAL | Status: DC | PRN
Start: 2015-11-06 — End: 2016-03-05

## 2015-11-14 ENCOUNTER — Encounter: Payer: Self-pay | Admitting: Neurology

## 2015-11-14 ENCOUNTER — Ambulatory Visit (INDEPENDENT_AMBULATORY_CARE_PROVIDER_SITE_OTHER): Payer: 59 | Admitting: Neurology

## 2015-11-14 VITALS — BP 117/76 | HR 79 | Ht 64.0 in | Wt 129.5 lb

## 2015-11-14 DIAGNOSIS — G43019 Migraine without aura, intractable, without status migrainosus: Secondary | ICD-10-CM

## 2015-11-14 HISTORY — DX: Migraine without aura, intractable, without status migrainosus: G43.019

## 2015-11-14 MED ORDER — TOPIRAMATE 25 MG PO TABS: 75.0000 mg | ORAL_TABLET | Freq: Every day | ORAL | Status: DC

## 2015-11-14 NOTE — Progress Notes (Signed)
Reason for visit: Headache  Ana [name] is an 45 y.o. female  History of present illness:  Ana [name] is a 45 year old right-handed white female with a history of migraine headache. The patient has had frequent headaches throughout her life, she has had an episode of swelling across the bridge of the nose, but this has not returned. The patient has undergone MRI evaluation of the brain that was unremarkable. She was placed on Topamax for the headaches, she currently is on 50 mg at night. She indicates a significant benefit with her headaches. She is no longer having long headache events, she is having 1/4 of the days without any headaches. The patient is tolerating the Topamax quite well. She does occasionally have some episodes of nasal stuffiness with onset of her headache. She comes to this office for an evaluation.  Past Medical History  Diagnosis Date  . Seasonal allergies   . Asthma     intermittent  . GERD (gastroesophageal reflux disease)     controlled with diet  . Migraine headache     about 2 per yr  . Borderline hyperlipidemia     No med trials in the past  . Fibroids   . Headache 08/23/2015  . Common migraine with intractable migraine 11/14/2015    Past Surgical History  Procedure Laterality Date  . Cesarean section  2005, 2007    x2  . Myomectomy    . Dilation and curettage of uterus      missed ab  . Tonsillectomy    . Knee arthroscopy    . Wisdom tooth extraction    . Abdominal hysterectomy  08/31/2012    Procedure: HYSTERECTOMY ABDOMINAL;  Surgeon: Luz Lex, MD;  Location: [facility];  Service: Gynecology;  Laterality: N/A;    Family History  Problem Relation Age of Onset  . Arthritis Mother   . Hypertension Mother   . Hyperlipidemia Father   . Hypertension Father   . Cancer Paternal Grandmother     breast  . Arthritis Maternal Grandmother   . Heart disease Paternal Grandfather   . Migraines Neg Hx     Social history:  reports that she has  never smoked. She has never used smokeless tobacco. She reports that she does not drink alcohol or use illicit drugs.    Allergies  Allergen Reactions  . Omnicef [Cefdinir] Other (See Comments)    GI upset  . Trazodone And Nefazodone Nausea And Vomiting    Medications:  Prior to Admission medications   Medication Sig Start Date End Date Taking? Authorizing Provider  cetirizine (ZYRTEC) 10 MG tablet Take 10 mg by mouth daily.   Yes Historical Provider, MD  ibuprofen (ADVIL,MOTRIN) 400 MG tablet Take 400 mg by mouth every 6 (six) hours as needed.   Yes Historical Provider, MD  phentermine (ADIPEX-P) 37.5 MG tablet One-half tab by mouth qAM 09/22/15  Yes Silverio Decamp, MD  topiramate (TOPAMAX) 25 MG tablet Take one tablet at night for one week, then take 2 tablets at night 08/23/15  Yes Kathrynn Ducking, MD  zolpidem (AMBIEN) 10 MG tablet Take 1 tablet (10 mg total) by mouth at bedtime as needed for sleep. 11/06/15  Yes Silverio Decamp, MD    ROS:  Out of a complete 14 system review of symptoms, the patient complains only of the following symptoms, and all other reviewed systems are negative.  Headache  Blood pressure 117/76, pulse 79, height 5\' 4"  (1.626 m),  weight 129 lb 8 oz (58.741 kg), last menstrual period 07/30/2012.  Physical Exam  General: The patient is alert and cooperative at the time of the examination.  Skin: No significant peripheral edema is noted.   Neurologic Exam  Mental status: The patient is alert and oriented x 3 at the time of the examination. The patient has apparent normal recent and remote memory, with an apparently normal attention span and concentration ability.   Cranial nerves: Facial symmetry is present. Speech is normal, no aphasia or dysarthria is noted. Extraocular movements are full. Visual fields are full.  Motor: The patient has good strength in all 4 extremities.  Sensory examination: Soft touch sensation is symmetric on the  face, arms, and legs.  Coordination: The patient has good finger-nose-finger and heel-to-shin bilaterally.  Gait and station: The patient has a normal gait. Tandem gait is normal. Romberg is negative. No drift is seen.  Reflexes: Deep tendon reflexes are symmetric.   MRI brain 09/06/15:  IMPRESSION: This is a normal MRI of the brain with and without contrast.   * MRI scan images were reviewed online. I agree with the written report.    Assessment/Plan:  1. Common migraine  The patient has intractable migraine headache, but she has had a good response to Topamax. We will increase the medication to 75 mg at night. She will follow-up in 4 months, sooner if needed. She will contact our office if she is having difficulty tolerating the medication.  Jill Alexanders MD 11/14/2015 6:29 PM  [facility] [address] [place], [place] [address]  Phone [phone] Fax [phone]

## 2015-11-14 NOTE — Patient Instructions (Signed)
   We will go up on the Topamax taking 75 mg at night.   Migraine Headache A migraine headache is an intense, throbbing pain on one or both sides of your head. A migraine can last for 30 minutes to several hours. CAUSES  The exact cause of a migraine headache is not always known. However, a migraine may be caused when nerves in the brain become irritated and release chemicals that cause inflammation. This causes pain. Certain things may also trigger migraines, such as:  Alcohol.  Smoking.  Stress.  Menstruation.  Aged cheeses.  Foods or drinks that contain nitrates, glutamate, aspartame, or tyramine.  Lack of sleep.  Chocolate.  Caffeine.  Hunger.  Physical exertion.  Fatigue.  Medicines used to treat chest pain (nitroglycerine), birth control pills, estrogen, and some blood pressure medicines. SIGNS AND SYMPTOMS  Pain on one or both sides of your head.  Pulsating or throbbing pain.  Severe pain that prevents daily activities.  Pain that is aggravated by any physical activity.  Nausea, vomiting, or both.  Dizziness.  Pain with exposure to bright lights, loud noises, or activity.  General sensitivity to bright lights, loud noises, or smells. Before you get a migraine, you may get warning signs that a migraine is coming (aura). An aura may include:  Seeing flashing lights.  Seeing bright spots, halos, or zigzag lines.  Having tunnel vision or blurred vision.  Having feelings of numbness or tingling.  Having trouble talking.  Having muscle weakness. DIAGNOSIS  A migraine headache is often diagnosed based on:  Symptoms.  Physical exam.  A CT scan or MRI of your head. These imaging tests cannot diagnose migraines, but they can help rule out other causes of headaches. TREATMENT Medicines may be given for pain and nausea. Medicines can also be given to help prevent recurrent migraines.  HOME CARE INSTRUCTIONS  Only take over-the-counter or  prescription medicines for pain or discomfort as directed by your health care provider. The use of long-term narcotics is not recommended.  Lie down in a dark, quiet room when you have a migraine.  Keep a journal to find out what may trigger your migraine headaches. For example, write down:  What you eat and drink.  How much sleep you get.  Any change to your diet or medicines.  Limit alcohol consumption.  Quit smoking if you smoke.  Get 7-9 hours of sleep, or as recommended by your health care provider.  Limit stress.  Keep lights dim if bright lights bother you and make your migraines worse. SEEK IMMEDIATE MEDICAL CARE IF:   Your migraine becomes severe.  You have a fever.  You have a stiff neck.  You have vision loss.  You have muscular weakness or loss of muscle control.  You start losing your balance or have trouble walking.  You feel faint or pass out.  You have severe symptoms that are different from your first symptoms. MAKE SURE YOU:   Understand these instructions.  Will watch your condition.  Will get help right away if you are not doing well or get worse.   This information is not intended to replace advice given to you by your health care provider. Make sure you discuss any questions you have with your health care provider.   Document Released: 09/30/2005 Document Revised: 10/21/2014 Document Reviewed: 06/07/2013 Elsevier Interactive Patient Education [employer].

## 2015-11-24 ENCOUNTER — Ambulatory Visit: Payer: 59 | Admitting: Neurology

## 2015-12-22 ENCOUNTER — Ambulatory Visit: Payer: 59 | Admitting: Sports Medicine

## 2016-03-05 ENCOUNTER — Other Ambulatory Visit: Payer: Self-pay | Admitting: Sports Medicine

## 2016-03-13 ENCOUNTER — Ambulatory Visit (INDEPENDENT_AMBULATORY_CARE_PROVIDER_SITE_OTHER): Payer: 59 | Admitting: Adult Health

## 2016-03-13 ENCOUNTER — Encounter: Payer: Self-pay | Admitting: Adult Health

## 2016-03-13 VITALS — BP 102/64 | HR 70 | Resp 14 | Ht 64.0 in | Wt 128.0 lb

## 2016-03-13 DIAGNOSIS — G43009 Migraine without aura, not intractable, without status migrainosus: Secondary | ICD-10-CM | POA: Diagnosis not present

## 2016-03-13 MED ORDER — PREDNISONE 5 MG PO TABS: ORAL_TABLET | ORAL | Status: DC

## 2016-03-13 MED ORDER — TOPIRAMATE 25 MG PO TABS: 100.0000 mg | ORAL_TABLET | Freq: Every day | ORAL | Status: DC

## 2016-03-13 NOTE — Progress Notes (Signed)
I have read the note, and I agree with the clinical assessment and plan.  WILLIS,CHARLES KEITH   

## 2016-03-13 NOTE — Patient Instructions (Signed)
Increase Topamax 100 mg (4 tablets) at bedtime Try Prednisone dosepak If your symptoms worsen or you develop new symptoms please let us know.

## 2016-03-13 NOTE — Progress Notes (Signed)
PATIENT: Ana [name] DOB: 03/17/1971  REASON FOR VISIT: follow up- migraine headache HISTORY FROM: patient  HISTORY OF PRESENT ILLNESS: Ana [name] is a 45 year old female with a history of migraine headaches. She returns today for follow-up. She is currently taking Topamax 75 mg at bedtime. She reports that this was working well for her at first. She states in the last couple months her headache frequency has increased. She states that she has a daily dull headache that can last for 2 weeks. She states that Tylenol nor Motrin help her headache. Her headache is normally located across the forehead. She does have phonophobia. With severe headache she may notice photophobia as well as nausea and vomiting. She states that she has a dull headache that she's been dealing with this week. She reports that she has tolerated Topamax well with no side effects. She returns today for an evaluation.  HISTORY 11/14/15 (WILLIS): Ana [name] is a 45 year old right-handed white female with a history of migraine headache. The patient has had frequent headaches throughout her life, she has had an episode of swelling across the bridge of the nose, but this has not returned. The patient has undergone MRI evaluation of the brain that was unremarkable. She was placed on Topamax for the headaches, she currently is on 50 mg at night. She indicates a significant benefit with her headaches. She is no longer having long headache events, she is having 1/4 of the days without any headaches. The patient is tolerating the Topamax quite well. She does occasionally have some episodes of nasal stuffiness with onset of her headache. She comes to this office for an evaluation.       REVIEW OF SYSTEMS: Out of a complete 14 system review of symptoms, the patient complains only of the following symptoms, and all other reviewed systems are negative.  See history of present illness  ALLERGIES: Allergies  Allergen Reactions  . Omnicef  [Cefdinir] Other (See Comments)    GI upset  . Trazodone And Nefazodone Nausea And Vomiting    HOME MEDICATIONS: Outpatient Prescriptions Prior to Visit  Medication Sig Dispense Refill  . cetirizine (ZYRTEC) 10 MG tablet Take 10 mg by mouth daily.    Marland Kitchen ibuprofen (ADVIL,MOTRIN) 400 MG tablet Take 400 mg by mouth every 6 (six) hours as needed.    . topiramate (TOPAMAX) 25 MG tablet Take 3 tablets (75 mg total) by mouth at bedtime. 90 tablet 3  . zolpidem (AMBIEN) 10 MG tablet Take 1 tablet (10 mg total) by mouth at bedtime as needed. for sleep Needs f/u appointment for insomnia 30 tablet 0  . phentermine (ADIPEX-P) 37.5 MG tablet One-half tab by mouth qAM 45 tablet 0   No facility-administered medications prior to visit.    PAST MEDICAL HISTORY: Past Medical History  Diagnosis Date  . Seasonal allergies   . Asthma     intermittent  . GERD (gastroesophageal reflux disease)     controlled with diet  . Migraine headache     about 2 per yr  . Borderline hyperlipidemia     No med trials in the past  . Fibroids   . Headache 08/23/2015  . Common migraine with intractable migraine 11/14/2015    PAST SURGICAL HISTORY: Past Surgical History  Procedure Laterality Date  . Cesarean section  2005, 2007    x2  . Myomectomy    . Dilation and curettage of uterus      missed ab  . Tonsillectomy    .  Knee arthroscopy    . Wisdom tooth extraction    . Abdominal hysterectomy  08/31/2012    Procedure: HYSTERECTOMY ABDOMINAL;  Surgeon: Luz Lex, MD;  Location: [facility];  Service: Gynecology;  Laterality: N/A;    FAMILY HISTORY: Family History  Problem Relation Age of Onset  . Arthritis Mother   . Hypertension Mother   . Hyperlipidemia Father   . Hypertension Father   . Cancer Paternal Grandmother     breast  . Arthritis Maternal Grandmother   . Heart disease Paternal Grandfather   . Migraines Neg Hx     SOCIAL HISTORY: Social History   Social History  . Marital Status:  Married    Spouse Name: Ana [name]  . Number of Children: 2  . Years of Education: College   Occupational History  .  [employer]   Social History Main Topics  . Smoking status: Never Smoker   . Smokeless tobacco: Never Used  . Alcohol Use: No     Comment: social 1 glass of wine monthly  . Drug Use: No  . Sexual Activity: Not on file   Other Topics Concern  . Not on file   Social History Narrative   Married.  Two kids.   College grad--[place].   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   No T/A/Ds.   Caffeine Use: 1-2 cup daily   Patient is right handed.             PHYSICAL EXAM  Filed Vitals:   03/13/16 0728  BP: 102/64  Pulse: 70  Resp: 14  Height: 5\' 4"  (1.626 m)  Weight: 128 lb (58.06 kg)   Body mass index is 21.96 kg/(m^2).  Generalized: Well developed, in no acute distress   Neurological examination  Mentation: Alert oriented to time, place, history taking. Follows all commands speech and language fluent Cranial nerve II-XII: Pupils were equal round reactive to light. Extraocular movements were full, visual field were full on confrontational test. Facial sensation and strength were normal. Uvula tongue midline. Head turning and shoulder shrug  were normal and symmetric. Motor: The motor testing reveals 5 over 5 strength of all 4 extremities. Good symmetric motor tone is noted throughout.  Sensory: Sensory testing is intact to soft touch on all 4 extremities. No evidence of extinction is noted.  Coordination: Cerebellar testing reveals good finger-nose-finger and heel-to-shin bilaterally.  Gait and station: Gait is normal. Tandem gait is normal. Romberg is negative. No drift is seen.  Reflexes: Deep tendon reflexes are symmetric and normal bilaterally.   DIAGNOSTIC DATA (LABS, IMAGING, TESTING) - I reviewed patient records, labs, notes, testing and imaging myself where available.  Lab Results  Component Value Date   WBC 6.2 01/23/2015   HGB 13.9  01/23/2015   HCT 40.3 01/23/2015   MCV 83.4 01/23/2015   PLT 265 01/23/2015      Component Value Date/Time   NA 138 01/23/2015 1014   K 3.8 01/23/2015 1014   CL 102 01/23/2015 1014   CO2 25 01/23/2015 1014   GLUCOSE 80 01/23/2015 1014   BUN 11 01/23/2015 1014   CREATININE 0.79 01/23/2015 1014   CALCIUM 9.2 01/23/2015 1014   PROT 7.1 01/23/2015 1014   ALBUMIN 4.8 01/23/2015 1014   AST 16 01/23/2015 1014   ALT 11 01/23/2015 1014   ALKPHOS 51 01/23/2015 1014   BILITOT 0.8 01/23/2015 1014     ASSESSMENT AND PLAN 45 y.o. year old female  has a past  medical history of Seasonal allergies; Asthma; GERD (gastroesophageal reflux disease); Migraine headache; Borderline hyperlipidemia; Fibroids; Headache (08/23/2015); and Common migraine with intractable migraine (11/14/2015). here with:  1. Migraine  The patient's migraine frequency has increased. We will increase Topamax  to 100 mg at bedtime. I'll also provide her with a prednisone Dosepak to hopefully break the cycle of her current headache. Patient advised that if her headache frequency does not improve with the increase in Topamax she should let us know. She will return in 3 months or sooner if needed.     Ward Givens, MSN, NP-C 03/13/2016, 7:29 AM [facility] [address], [place], [place] [address] [phone]

## 2016-04-05 ENCOUNTER — Ambulatory Visit (INDEPENDENT_AMBULATORY_CARE_PROVIDER_SITE_OTHER): Payer: 59 | Admitting: Sports Medicine

## 2016-04-05 ENCOUNTER — Encounter: Payer: Self-pay | Admitting: Sports Medicine

## 2016-04-05 VITALS — BP 112/70 | HR 89 | Resp 16 | Wt 124.1 lb

## 2016-04-05 DIAGNOSIS — G4700 Insomnia, unspecified: Secondary | ICD-10-CM

## 2016-04-05 DIAGNOSIS — Z299 Encounter for prophylactic measures, unspecified: Secondary | ICD-10-CM | POA: Diagnosis not present

## 2016-04-05 DIAGNOSIS — G43019 Migraine without aura, intractable, without status migrainosus: Secondary | ICD-10-CM

## 2016-04-05 DIAGNOSIS — E785 Hyperlipidemia, unspecified: Secondary | ICD-10-CM | POA: Diagnosis not present

## 2016-04-05 MED ORDER — ZOLPIDEM TARTRATE 10 MG PO TABS: 10.0000 mg | ORAL_TABLET | Freq: Every evening | ORAL | Status: DC | PRN

## 2016-04-05 NOTE — Assessment & Plan Note (Signed)
Checking routine blood work including HIV screening

## 2016-04-05 NOTE — Assessment & Plan Note (Signed)
Doing well, occasionally needs prednisone to break the headaches, Topamax has worked extremely well.

## 2016-04-05 NOTE — Assessment & Plan Note (Signed)
Doing well with Ambien, refilling medication

## 2016-04-05 NOTE — Progress Notes (Signed)
  Subjective:    CC: Follow-up  HPI: Insomnia: Well controlled on Ambien, needs a refill.  Migraines: Controlled with Topamax.  Preventive measures: Due for some routine blood work.  Hyperlipidemia: Due for blood work.  Past medical history, Surgical history, Family history not pertinant except as noted below, Social history, Allergies, and medications have been entered into the medical record, reviewed, and no changes needed.   Review of Systems: No fevers, chills, night sweats, weight loss, chest pain, or shortness of breath.   Objective:    General: Well Developed, well nourished, and in no acute distress.  Neuro: Alert and oriented x3, extra-ocular muscles intact, sensation grossly intact.  HEENT: Normocephalic, atraumatic, pupils equal round reactive to light, neck supple, no masses, no lymphadenopathy, thyroid nonpalpable.  Skin: Warm and dry, no rashes. Cardiac: Regular rate and rhythm, no murmurs rubs or gallops, no lower extremity edema.  Respiratory: Clear to auscultation bilaterally. Not using accessory muscles, speaking in full sentences.  Impression and Recommendations:

## 2016-04-05 NOTE — Assessment & Plan Note (Signed)
Due for routine blood work. 

## 2016-05-27 ENCOUNTER — Telehealth: Payer: Self-pay | Admitting: Adult Health

## 2016-05-27 MED ORDER — PREDNISONE 5 MG PO TABS: ORAL_TABLET | ORAL | 0 refills | Status: DC

## 2016-05-27 NOTE — Addendum Note (Signed)
Addended by: Trudie Buckler on: 05/27/2016 01:15 PM   Modules accepted: Orders

## 2016-05-27 NOTE — Telephone Encounter (Signed)
Pt called in saying she is in a cycle with her migraines and was told to call in to ask for prednisone. Please call and advise. [phone]

## 2016-05-27 NOTE — Telephone Encounter (Signed)
I called the patient. She states that she's had a headache since last Tuesday. She states that originally she thought it was sinuses-- maybe she was catching a cold. Her headache got worse on Sunday and has not resolved. She is on Topamax 100 mg at bedtime. In the past she has taken a prednisone Dosepak with good benefit. I will reorder this today. Advised that if her headache does not improve with prednisone she should let us know.

## 2016-06-13 ENCOUNTER — Ambulatory Visit (INDEPENDENT_AMBULATORY_CARE_PROVIDER_SITE_OTHER): Payer: 59 | Admitting: Adult Health

## 2016-06-13 ENCOUNTER — Encounter: Payer: Self-pay | Admitting: Adult Health

## 2016-06-13 VITALS — BP 107/72 | HR 73 | Resp 16 | Ht 64.0 in | Wt 130.0 lb

## 2016-06-13 DIAGNOSIS — G43009 Migraine without aura, not intractable, without status migrainosus: Secondary | ICD-10-CM | POA: Diagnosis not present

## 2016-06-13 MED ORDER — TOPIRAMATE 50 MG PO TABS: 150.0000 mg | ORAL_TABLET | Freq: Every day | ORAL | 11 refills | Status: DC

## 2016-06-13 NOTE — Progress Notes (Signed)
I have read the note, and I agree with the clinical assessment and plan.  Cesare Sumlin KEITH   

## 2016-06-13 NOTE — Patient Instructions (Signed)
Increase Topamax to 150 mg at bedtime Try imitrex at the onset of migraine If your symptoms worsen or you develop new symptoms please let us know.

## 2016-06-13 NOTE — Progress Notes (Signed)
PATIENT: Ana [name] DOB: 03-31-1971  REASON FOR VISIT: follow up HISTORY FROM: patient  HISTORY OF PRESENT ILLNESS: Ana [name] is a 45 year old female with a history of migraine headaches. She returns today for follow-up. She is currently taken Topamax 100 mg at bedtime. She reports that Topamax has been beneficial for the intensity of her headaches. She feels that the frequency may be slightly better. She states he recently the frequency has increased some. At first she related to allergies but she feels it may be just migraines. She reports that her headaches are typically a 5 out of 10 on the pain scale. Her headaches typically start out mild but gets worse after several days. She does have nausea, photophobia, phonophobia. She reports that her headaches are typical located across the forehead and behind the eyes. She has tried a prednisone Dosepak in the past with no benefit. She states that she had an old prescription of Imitrex that she had refilled but has not taken that. She returns today for an evaluation.  HISTORY 03/13/16: Ana [name] is a 45 year old female with a history of migraine headaches. She returns today for follow-up. She is currently taking Topamax 75 mg at bedtime. She reports that this was working well for her at first. She states in the last couple months her headache frequency has increased. She states that she has a daily dull headache that can last for 2 weeks. She states that Tylenol nor Motrin help her headache. Her headache is normally located across the forehead. She does have phonophobia. With severe headache she may notice photophobia as well as nausea and vomiting. She states that she has a dull headache that she's been dealing with this week. She reports that she has tolerated Topamax well with no side effects. She returns today for an evaluation.  HISTORY 11/14/15 (WILLIS): Ana [name] is a 45 year old right-handed white female with a history of migraine  headache. The patient has had frequent headaches throughout her life, she has had an episode of swelling across the bridge of the nose, but this has not returned. The patient has undergone MRI evaluation of the brain that was unremarkable. She was placed on Topamax for the headaches, she currently is on 50 mg at night. She indicates a significant benefit with her headaches. She is no longer having long headache events, she is having 1/4 of the days without any headaches. The patient is tolerating the Topamax quite well. She does occasionally have some episodes of nasal stuffiness with onset of her headache. She comes to this office for an evaluation.  REVIEW OF SYSTEMS: Out of a complete 14 system review of symptoms, the patient complains only of the following symptoms, and all other reviewed systems are negative.  See history of present illness ALLERGIES: Allergies  Allergen Reactions  . Omnicef [Cefdinir] Other (See Comments)    GI upset  . Trazodone And Nefazodone Nausea And Vomiting    HOME MEDICATIONS: Outpatient Medications Prior to Visit  Medication Sig Dispense Refill  . cetirizine (ZYRTEC) 10 MG tablet Take 10 mg by mouth daily.    Marland Kitchen ibuprofen (ADVIL,MOTRIN) 400 MG tablet Take 400 mg by mouth every 6 (six) hours as needed.    . zolpidem (AMBIEN) 10 MG tablet Take 1 tablet (10 mg total) by mouth at bedtime as needed. for sleep 90 tablet 3  . topiramate (TOPAMAX) 25 MG tablet Take 4 tablets (100 mg total) by mouth at bedtime. 120 tablet 3  .  predniSONE (DELTASONE) 5 MG tablet Begin taking 6 tablets daily, taper by one tablet daily until off the medication. 21 tablet 0   No facility-administered medications prior to visit.     PAST MEDICAL HISTORY: Past Medical History:  Diagnosis Date  . Asthma    intermittent  . Borderline hyperlipidemia    No med trials in the past  . Common migraine with intractable migraine 11/14/2015  . Fibroids   . GERD (gastroesophageal reflux disease)     controlled with diet  . Headache 08/23/2015  . Migraine headache    about 2 per yr  . Seasonal allergies     PAST SURGICAL HISTORY: Past Surgical History:  Procedure Laterality Date  . ABDOMINAL HYSTERECTOMY  08/31/2012   Procedure: HYSTERECTOMY ABDOMINAL;  Surgeon: Ana Lex, MD;  Location: [facility];  Service: Gynecology;  Laterality: N/A;  . CESAREAN SECTION  2005, 2007   x2  . DILATION AND CURETTAGE OF UTERUS     missed ab  . KNEE ARTHROSCOPY    . MYOMECTOMY    . TONSILLECTOMY    . WISDOM TOOTH EXTRACTION      FAMILY HISTORY: Family History  Problem Relation Age of Onset  . Arthritis Mother   . Hypertension Mother   . Hyperlipidemia Father   . Hypertension Father   . Cancer Paternal Grandmother     breast  . Arthritis Maternal Grandmother   . Heart disease Paternal Grandfather   . Migraines Neg Hx     SOCIAL HISTORY: Social History   Social History  . Marital status: Married    Spouse name: Ana [name]  . Number of children: 2  . Years of education: College   Occupational History  .  Ana [name]   Social History Main Topics  . Smoking status: Never Smoker  . Smokeless tobacco: Never Used  . Alcohol use No     Comment: social 1 glass of wine monthly  . Drug use: No  . Sexual activity: Not on file   Other Topics Concern  . Not on file   Social History Narrative   Married.  Two kids.   College grad--[employer].   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   Caffeine Use: 1-2 cup daily   Patient is right handed.             PHYSICAL EXAM  Vitals:   06/13/16 0726  BP: 107/72  Pulse: 73  Resp: 16  Weight: 130 lb (59 kg)  Height: 5\' 4"  (1.626 m)   Body mass index is 22.31 kg/m.  Generalized: Well developed, in no acute distress   Neurological examination  Mentation: Alert oriented to time, place, history taking. Follows all commands speech and language fluent Cranial nerve II-XII: Pupils were equal round reactive to  light. Extraocular movements were full, visual field were full on confrontational test. Facial sensation and strength were normal. Uvula tongue midline. Head turning and shoulder shrug  were normal and symmetric. Motor: The motor testing reveals 5 over 5 strength of all 4 extremities. Good symmetric motor tone is noted throughout.  Sensory: Sensory testing is intact to soft touch on all 4 extremities. No evidence of extinction is noted.  Coordination: Cerebellar testing reveals good finger-nose-finger and heel-to-shin bilaterally.  Gait and station: Gait is normal. Tandem gait is normal. Romberg is negative. No drift is seen.  Reflexes: Deep tendon reflexes are symmetric and normal bilaterally.   DIAGNOSTIC DATA (LABS, IMAGING, TESTING) -  I reviewed patient records, labs, notes, testing and imaging myself where available.  Lab Results  Component Value Date   WBC 6.2 01/23/2015   HGB 13.9 01/23/2015   HCT 40.3 01/23/2015   MCV 83.4 01/23/2015   PLT 265 01/23/2015      Component Value Date/Time   NA 138 01/23/2015 1014   K 3.8 01/23/2015 1014   CL 102 01/23/2015 1014   CO2 25 01/23/2015 1014   GLUCOSE 80 01/23/2015 1014   BUN 11 01/23/2015 1014   CREATININE 0.79 01/23/2015 1014   CALCIUM 9.2 01/23/2015 1014   PROT 7.1 01/23/2015 1014   ALBUMIN 4.8 01/23/2015 1014   AST 16 01/23/2015 1014   ALT 11 01/23/2015 1014   ALKPHOS 51 01/23/2015 1014   BILITOT 0.8 01/23/2015 1014   Lab Results  Component Value Date   CHOL 217 (H) 05/05/2014   HDL 75 05/05/2014   LDLCALC 125 (H) 05/05/2014   TRIG 84 05/05/2014   CHOLHDL 2.9 05/05/2014   Lab Results  Component Value Date   HGBA1C 5.1 05/05/2014   No results found for: [id number] Lab Results  Component Value Date   TSH 1.375 05/05/2014      ASSESSMENT AND PLAN 45 y.o. year old female  has a past medical history of Asthma; Borderline hyperlipidemia; Common migraine with intractable migraine (11/14/2015); Fibroids; GERD  (gastroesophageal reflux disease); Headache (08/23/2015); Migraine headache; and Seasonal allergies. here with:  1. Migraine headaches  The patient will increase Topamax 150 mg at bedtime. I have instructed patient that she can use Imitrex to treat her headaches. She has no history of coronary artery disease, stroke, hypertension or diabetes. I have reviewed the side effects of Imitrex with the patient. Advised that she should take this at the onset of her headache and repeat in 2 hours if necessary. If she continues to have frequent headaches she will let us know. She will follow-up in 6 months or sooner if needed.     Ward Givens, MSN, NP-C 06/13/2016, 11:20 AM [facility] [address] [place], [place] [address] [phone]

## 2016-06-18 ENCOUNTER — Telehealth: Payer: Self-pay | Admitting: Adult Health

## 2016-06-18 MED ORDER — PREDNISONE 5 MG PO TABS: ORAL_TABLET | ORAL | 0 refills | Status: DC

## 2016-06-18 NOTE — Telephone Encounter (Signed)
I have spoken with Takhia this afternoon.  She sts. she has had h/a for one week, not able to break it with current meds.  Sts. she  has had Medrol dose pk twice for h/a.  Once it helped and once it didn't.  Per Jinny Blossom, I have offered dose pk vs IV.  Shady sts. she would like to try dose pk and will call back if no relief of h/a after 1-2 days.  [facility] is [facility] [place]

## 2016-06-18 NOTE — Telephone Encounter (Signed)
Pt called in requesting rx of prednisone. She has had a migraine since last Wednesday. Please call and advise [phone]

## 2016-06-18 NOTE — Telephone Encounter (Signed)
Dosepak prescription e- prescribed

## 2016-07-25 ENCOUNTER — Telehealth: Payer: Self-pay | Admitting: Neurology

## 2016-07-25 MED ORDER — TOPIRAMATE 25 MG PO TABS: ORAL_TABLET | ORAL | 4 refills | Status: DC

## 2016-07-25 NOTE — Telephone Encounter (Signed)
Pt called in for a refill on Topamax. She is wondering if she can decrease the dose to 125mg  a day for a week. She is extremely tired all the time since the increase and wants to see if there is a correlation. Please call and advise [phone]- Can the rx be called in as 125 mg ?  [id number] - [place], [place] - [address] AT [place]

## 2016-07-25 NOTE — Telephone Encounter (Signed)
We will reduce the Topamax dosing to 125 mg at night to see if this improves the symptoms of fatigue.

## 2016-08-05 ENCOUNTER — Other Ambulatory Visit: Payer: Self-pay | Admitting: Sports Medicine

## 2016-08-05 DIAGNOSIS — Z8669 Personal history of other diseases of the nervous system and sense organs: Secondary | ICD-10-CM

## 2016-09-30 ENCOUNTER — Ambulatory Visit (INDEPENDENT_AMBULATORY_CARE_PROVIDER_SITE_OTHER): Payer: 59 | Admitting: Sports Medicine

## 2016-09-30 ENCOUNTER — Encounter: Payer: Self-pay | Admitting: Sports Medicine

## 2016-09-30 VITALS — BP 120/73 | HR 80 | Resp 18 | Wt 128.0 lb

## 2016-09-30 DIAGNOSIS — Z23 Encounter for immunization: Secondary | ICD-10-CM

## 2016-09-30 DIAGNOSIS — H5789 Other specified disorders of eye and adnexa: Secondary | ICD-10-CM | POA: Insufficient documentation

## 2016-09-30 DIAGNOSIS — Z299 Encounter for prophylactic measures, unspecified: Secondary | ICD-10-CM

## 2016-09-30 DIAGNOSIS — H578 Other specified disorders of eye and adnexa: Secondary | ICD-10-CM | POA: Diagnosis not present

## 2016-09-30 MED ORDER — ZOLPIDEM TARTRATE 10 MG PO TABS: 10.0000 mg | ORAL_TABLET | Freq: Every evening | ORAL | 3 refills | Status: DC | PRN

## 2016-09-30 MED ORDER — MELOXICAM 15 MG PO TABS: ORAL_TABLET | ORAL | 3 refills | Status: DC

## 2016-09-30 NOTE — Assessment & Plan Note (Signed)
There appears to be some irritation that developed acutely on the right temporal sclera. I do not see any corneal abrasions on flourescein exam. Adding NSAIDs, I would like her to see Dr. Gwenevere Ghazi with optometry today.

## 2016-09-30 NOTE — Progress Notes (Signed)
  Subjective:    CC: Right eye  HPI: Over the past day this pleasant 45 year old female has had increasing pain, and a gritty feeling in her right eye, lateral aspect of her sclera. She has not operated any power equipment, and doesn't recall scratching her eye. No upper respiratory symptoms, no visual changes.  Past medical history:  Negative.  See flowsheet/record as well for more information.  Surgical history: Negative.  See flowsheet/record as well for more information.  Family history: Negative.  See flowsheet/record as well for more information.  Social history: Negative.  See flowsheet/record as well for more information.  Allergies, and medications have been entered into the medical record, reviewed, and no changes needed.   Review of Systems: No fevers, chills, night sweats, weight loss, chest pain, or shortness of breath.   Objective:    General: Well Developed, well nourished, and in no acute distress.  Neuro: Alert and oriented x3, extra-ocular muscles intact, sensation grossly intact.  HEENT: Normocephalic, atraumatic, pupils equal round reactive to light, neck supple, no masses, no lymphadenopathy, thyroid nonpalpable. Sclera/conjunctiva is injected, worse at the lateral sclera. Floourescein exam is unremarkable with the exception of concentration of flourescein at the lateral sclera, no evidence of corneal abrasion vision is grossly normal. Skin: Warm and dry, no rashes. Cardiac: Regular rate and rhythm, no murmurs rubs or gallops, no lower extremity edema.  Respiratory: Clear to auscultation bilaterally. Not using accessory muscles, speaking in full sentences.  Impression and Recommendations:    Irritation of right eye There appears to be some irritation that developed acutely on the right temporal sclera. I do not see any corneal abrasions on flourescein exam. Adding NSAIDs, I would like her to see Dr. Gwenevere Ghazi with optometry today.

## 2016-09-30 NOTE — Patient Instructions (Signed)
Scleritis and Episcleritis The outer part of the eyeball is covered with a tough fibrous covering called the sclera. It is the white part of the eye. This tough covering also has a thin membrane lying on top of it called the episclera.   When the sclera becomes red and sore (inflamed), it is called scleritis.  When the episclera becomes inflamed, it is called episcleritis. CAUSES   Scleritis is usually more severe and is associated with autoimmune diseases such as:  Rheumatoid arthritis.  Inflammations of the bowel such as Crohn's Disease (regional enteritis).  Ulcerative colitis.  Episcleritis usually has no known cause. SYMPTOMS  Both scleritis and episcleritis cause red patches or a nodule on the eye. DIAGNOSIS  This condition should be examined by an ophthalmologist. This is because very strong medications that have side effects to the body and eye may be required to treat severe attacks. Further investigations into the patient's general health may be necessary. TREATMENT   Episcleritis tends to get better without treatment within a week or two.  Scleritis is more severe. Often, your caregiver will prescribe steroids by mouth (orally) or as drops in the eye. This treatment helps lessen the redness and soreness (inflammation). HOME CARE INSTRUCTIONS   Take all medications as directed.  Keep your follow-up appointments as directed.  Avoid irritation of the involved eye(s).  Stop using hard or soft contact lenses until your caregiver tells you that it is safe to use them. SEEK MEDICAL CARE IF:   Redness or irritation gets worse.  You develop pain or sensitivity to light.  You develop any change in vision in the involved eye(s). This information is not intended to replace advice given to you by your health care provider. Make sure you discuss any questions you have with your health care provider. Document Released: 09/24/2001 Document Revised: 12/23/2011 Document Reviewed:  08/22/2015 Elsevier Interactive Patient Education  2017 [employer].

## 2016-12-18 ENCOUNTER — Other Ambulatory Visit: Payer: Self-pay | Admitting: Neurology

## 2016-12-23 ENCOUNTER — Ambulatory Visit: Payer: 59 | Admitting: Adult Health

## 2016-12-26 ENCOUNTER — Ambulatory Visit (INDEPENDENT_AMBULATORY_CARE_PROVIDER_SITE_OTHER): Payer: 59 | Admitting: Adult Health

## 2016-12-26 ENCOUNTER — Encounter: Payer: Self-pay | Admitting: Adult Health

## 2016-12-26 VITALS — BP 104/72 | HR 77 | Resp 14 | Ht 64.0 in | Wt 129.0 lb

## 2016-12-26 DIAGNOSIS — G43009 Migraine without aura, not intractable, without status migrainosus: Secondary | ICD-10-CM | POA: Diagnosis not present

## 2016-12-26 NOTE — Progress Notes (Signed)
PATIENT: Ana [name] DOB: [birth year]  REASON FOR VISIT: follow up- migraine HISTORY FROM: patient  HISTORY OF PRESENT ILLNESS: Ana [name] is a 46 year old female with a history of migraine headaches. She returns today for follow-up. She is currently taking Topamax 125 mg at bedtime. She reports that this is working well for her. She states that during the winter months she's had very few headaches. She states that typically when the weather changes she begins to have more headaches. She did try Topamax 150 mg however it made her feel "foggy headed." She denies any new neurological symptoms. Returns today for an evaluation.  HISTORY 06/13/16: Ana [name] is a 46 year old female with a history of migraine headaches. She returns today for follow-up. She is currently taken Topamax 100 mg at bedtime. She reports that Topamax has been beneficial for the intensity of her headaches. She feels that the frequency may be slightly better. She states he recently the frequency has increased some. At first she related to allergies but she feels it may be just migraines. She reports that her headaches are typically a 5 out of 10 on the pain scale. Her headaches typically start out mild but gets worse after several days. She does have nausea, photophobia, phonophobia. She reports that her headaches are typical located across the forehead and behind the eyes. She has tried a prednisone Dosepak in the past with no benefit. She states that she had an old prescription of Imitrex that she had refilled but has not taken that. She returns today for an evaluation.  REVIEW OF SYSTEMS: Out of a complete 14 system review of symptoms, the patient complains only of the following symptoms, and all other reviewed systems are negative.  Insomnia, frequent waking  ALLERGIES: Allergies  Allergen Reactions  . Omnicef [Cefdinir] Other (See Comments)    GI upset  . Trazodone And Nefazodone Nausea And Vomiting    HOME  MEDICATIONS: Outpatient Medications Prior to Visit  Medication Sig Dispense Refill  . cetirizine (ZYRTEC) 10 MG tablet Take 10 mg by mouth daily.    . fluticasone (FLONASE) 50 MCG/ACT nasal spray INHALE 1 SPRAY IN EACH NOSTRIL TWICE A DAY USE LEFT HAND FOR RIGHT SIDE AND RIGHT FOR LEFT SIDE 16 g 5  . topiramate (TOPAMAX) 25 MG tablet TAKE 5 TABLETS AT NIGHT 150 tablet 4  . zolpidem (AMBIEN) 10 MG tablet Take 1 tablet (10 mg total) by mouth at bedtime as needed. for sleep 90 tablet 3  . ibuprofen (ADVIL,MOTRIN) 400 MG tablet Take 400 mg by mouth every 6 (six) hours as needed.    . meloxicam (MOBIC) 15 MG tablet One tab PO qAM with breakfast for 2 weeks, then daily prn pain. 30 tablet 3  . predniSONE (DELTASONE) 5 MG tablet Begin taking 6 tablets daily, taper by one tablet daily until off the medication. 21 tablet 0   No facility-administered medications prior to visit.     PAST MEDICAL HISTORY: Past Medical History:  Diagnosis Date  . Asthma    intermittent  . Borderline hyperlipidemia    No med trials in the past  . Common migraine with intractable migraine 11/14/2015  . Fibroids   . GERD (gastroesophageal reflux disease)    controlled with diet  . Headache 08/23/2015  . Migraine headache    about 2 per yr  . Seasonal allergies     PAST SURGICAL HISTORY: Past Surgical History:  Procedure Laterality Date  . ABDOMINAL HYSTERECTOMY  08/31/2012  Procedure: HYSTERECTOMY ABDOMINAL;  Surgeon: Luz Lex, MD;  Location: [facility];  Service: Gynecology;  Laterality: N/A;  . CESAREAN SECTION  2005, 2007   x2  . DILATION AND CURETTAGE OF UTERUS     missed ab  . KNEE ARTHROSCOPY    . MYOMECTOMY    . TONSILLECTOMY    . WISDOM TOOTH EXTRACTION      FAMILY HISTORY: Family History  Problem Relation Age of Onset  . Arthritis Mother   . Hypertension Mother   . Hyperlipidemia Father   . Hypertension Father   . Cancer Paternal Grandmother     breast  . Arthritis Maternal Grandmother    . Heart disease Paternal Grandfather   . Migraines Neg Hx     SOCIAL HISTORY: Social History   Social History  . Marital status: Married    Spouse name: Matt  . Number of children: 2  . Years of education: College   Occupational History  .  [employer]   Social History Main Topics  . Smoking status: Never Smoker  . Smokeless tobacco: Never Used  . Alcohol use No     Comment: social 1 glass of wine monthly  . Drug use: No  . Sexual activity: Not on file   Other Topics Concern  . Not on file   Social History Narrative   Married.  Two kids.   College grad--[employer].   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   No T/A/Ds.   Caffeine Use: 1-2 cup daily   Patient is right handed.             PHYSICAL EXAM  Vitals:   12/26/16 0721  BP: 104/72  Pulse: 77  Resp: 14  Weight: 129 lb (58.5 kg)  Height: 5\' 4"  (1.626 m)   Body mass index is 22.14 kg/m.  Generalized: Well developed, in no acute distress   Neurological examination  Mentation: Alert oriented to time, place, history taking. Follows all commands speech and language fluent Cranial nerve II-XII: Pupils were equal round reactive to light. Extraocular movements were full, visual field were full on confrontational test. Facial sensation and strength were normal. Uvula tongue midline. Head turning and shoulder shrug  were normal and symmetric. Motor: The motor testing reveals 5 over 5 strength of all 4 extremities. Good symmetric motor tone is noted throughout.  Sensory: Sensory testing is intact to soft touch on all 4 extremities. No evidence of extinction is noted.  Coordination: Cerebellar testing reveals good finger-nose-finger and heel-to-shin bilaterally.  Gait and station: Gait is normal. Tandem gait is normal. Romberg is negative. No drift is seen.  Reflexes: Deep tendon reflexes are symmetric and normal bilaterally.   DIAGNOSTIC DATA (LABS, IMAGING, TESTING) - I reviewed patient records,  labs, notes, testing and imaging myself where available.  Lab Results  Component Value Date   WBC 6.2 01/23/2015   HGB 13.9 01/23/2015   HCT 40.3 01/23/2015   MCV 83.4 01/23/2015   PLT 265 01/23/2015      Component Value Date/Time   NA 138 01/23/2015 1014   K 3.8 01/23/2015 1014   CL 102 01/23/2015 1014   CO2 25 01/23/2015 1014   GLUCOSE 80 01/23/2015 1014   BUN 11 01/23/2015 1014   CREATININE 0.79 01/23/2015 1014   CALCIUM 9.2 01/23/2015 1014   PROT 7.1 01/23/2015 1014   ALBUMIN 4.8 01/23/2015 1014   AST 16 01/23/2015 1014   ALT 11 01/23/2015 1014   ALKPHOS 51  01/23/2015 1014   BILITOT 0.8 01/23/2015 1014   Lab Results  Component Value Date   CHOL 217 (H) 05/05/2014   HDL 75 05/05/2014   LDLCALC 125 (H) 05/05/2014   TRIG 84 05/05/2014   CHOLHDL 2.9 05/05/2014   Lab Results  Component Value Date   HGBA1C 5.1 05/05/2014   No results found for: VITAMINB12 Lab Results  Component Value Date   TSH 1.375 05/05/2014      ASSESSMENT AND PLAN 46 y.o. year old female  has a past medical history of Asthma; Borderline hyperlipidemia; Common migraine with intractable migraine (11/14/2015); Fibroids; GERD (gastroesophageal reflux disease); Headache (08/23/2015); Migraine headache; and Seasonal allergies. here with:  1. Migraine headache  Overall the patient is doing well. Will continue on Topamax 125 mg at bedtime. I have advised that she should use Imitrex at the onset of migraine and repeat in 2 hours if needed. She voiced understanding. She will follow-up in 6 months with Dr. Jannifer Franklin.  I spent 15 minutes with the patient 50% of this time was spent counseling the patient on her medication.  Ward Givens, MSN, NP-C 12/26/2016, 7:45 AM [facility] [address], [place], [place] [address] [phone]

## 2016-12-26 NOTE — Progress Notes (Signed)
I have read the note, and I agree with the clinical assessment and plan.  Ewel Lona KEITH   

## 2016-12-26 NOTE — Patient Instructions (Signed)
Continue Topamax Use Imitrex If your symptoms worsen or you develop new symptoms please let us know.

## 2016-12-27 ENCOUNTER — Ambulatory Visit (INDEPENDENT_AMBULATORY_CARE_PROVIDER_SITE_OTHER): Payer: 59 | Admitting: Sports Medicine

## 2016-12-27 ENCOUNTER — Encounter: Payer: Self-pay | Admitting: Sports Medicine

## 2016-12-27 DIAGNOSIS — H6012 Cellulitis of left external ear: Secondary | ICD-10-CM | POA: Diagnosis not present

## 2016-12-27 DIAGNOSIS — A084 Viral intestinal infection, unspecified: Secondary | ICD-10-CM | POA: Insufficient documentation

## 2016-12-27 MED ORDER — PROMETHAZINE HCL 25 MG PO TABS: 25.0000 mg | ORAL_TABLET | Freq: Four times a day (QID) | ORAL | 3 refills | Status: DC | PRN

## 2016-12-27 MED ORDER — DOXYCYCLINE HYCLATE 100 MG PO TABS: 100.0000 mg | ORAL_TABLET | Freq: Two times a day (BID) | ORAL | 0 refills | Status: AC

## 2016-12-27 MED ORDER — DIPHENOXYLATE-ATROPINE 2.5-0.025 MG PO TABS: ORAL_TABLET | ORAL | 3 refills | Status: DC

## 2016-12-27 MED ORDER — PREDNISONE 50 MG PO TABS: 50.0000 mg | ORAL_TABLET | Freq: Every day | ORAL | 0 refills | Status: DC

## 2016-12-27 MED ORDER — ONDANSETRON 8 MG PO TBDP: 8.0000 mg | ORAL_TABLET | Freq: Three times a day (TID) | ORAL | 3 refills | Status: DC | PRN

## 2016-12-27 NOTE — Assessment & Plan Note (Signed)
Acutely inflamed, very painful. No palpable collection to drain. Doxycycline, prednisone. She does have a trip coming up to [facility]. Return to see me in about a week.

## 2016-12-27 NOTE — Progress Notes (Signed)
  Subjective:    CC: Ear pain  HPI: For the past several days this pleasant 46 year old has had increasing redness, swelling over her left earlobe, with exquisite tenderness to palpation. There is no drainage from the ear, no changes hearing, no trauma. Symptoms are moderate, persistent.  Past medical history:  Negative.  See flowsheet/record as well for more information.  Surgical history: Negative.  See flowsheet/record as well for more information.  Family history: Negative.  See flowsheet/record as well for more information.  Social history: Negative.  See flowsheet/record as well for more information.  Allergies, and medications have been entered into the medical record, reviewed, and no changes needed.   Review of Systems: No fevers, chills, night sweats, weight loss, chest pain, or shortness of breath.   Objective:    General: Well Developed, well nourished, and in no acute distress.  Neuro: Alert and oriented x3, extra-ocular muscles intact, sensation grossly intact.  HEENT: Normocephalic, atraumatic, pupils equal round reactive to light, neck supple, no masses, no lymphadenopathy, thyroid nonpalpable. Oropharynx, nasopharynx unremarkable, there is cellulitis of the left earlobe without palpable fluctuance. Skin: Warm and dry, no rashes. Cardiac: Regular rate and rhythm, no murmurs rubs or gallops, no lower extremity edema.  Respiratory: Clear to auscultation bilaterally. Not using accessory muscles, speaking in full sentences.  Impression and Recommendations:    Cellulitis of external ear, left Acutely inflamed, very painful. No palpable collection to drain. Doxycycline, prednisone. She does have a trip coming up to [facility]. Return to see me in about a week.  I spent 25 minutes with this patient, greater than 50% was face-to-face time counseling regarding the above diagnoses

## 2017-04-03 ENCOUNTER — Other Ambulatory Visit: Payer: Self-pay | Admitting: Sports Medicine

## 2017-04-03 DIAGNOSIS — Z299 Encounter for prophylactic measures, unspecified: Secondary | ICD-10-CM

## 2017-05-19 ENCOUNTER — Other Ambulatory Visit: Payer: Self-pay | Admitting: Neurology

## 2017-07-02 ENCOUNTER — Other Ambulatory Visit: Payer: Self-pay | Admitting: Sports Medicine

## 2017-07-02 DIAGNOSIS — Z299 Encounter for prophylactic measures, unspecified: Secondary | ICD-10-CM

## 2017-07-03 ENCOUNTER — Ambulatory Visit (INDEPENDENT_AMBULATORY_CARE_PROVIDER_SITE_OTHER): Payer: 59 | Admitting: Neurology

## 2017-07-03 ENCOUNTER — Encounter: Payer: Self-pay | Admitting: Neurology

## 2017-07-03 ENCOUNTER — Telehealth: Payer: Self-pay | Admitting: Neurology

## 2017-07-03 ENCOUNTER — Telehealth: Payer: Self-pay | Admitting: *Deleted

## 2017-07-03 VITALS — BP 112/71 | HR 81 | Ht 64.0 in | Wt 129.0 lb

## 2017-07-03 DIAGNOSIS — G43019 Migraine without aura, intractable, without status migrainosus: Secondary | ICD-10-CM | POA: Diagnosis not present

## 2017-07-03 MED ORDER — NARATRIPTAN HCL 2.5 MG PO TABS: 2.5000 mg | ORAL_TABLET | Freq: Two times a day (BID) | ORAL | 5 refills | Status: DC | PRN

## 2017-07-03 MED ORDER — TOPIRAMATE 25 MG PO TABS: 100.0000 mg | ORAL_TABLET | Freq: Two times a day (BID) | ORAL | 3 refills | Status: DC

## 2017-07-03 MED ORDER — TOPIRAMATE ER 100 MG PO CAP24: 100.0000 mg | ORAL_CAPSULE | Freq: Every day | ORAL | 3 refills | Status: DC

## 2017-07-03 NOTE — Telephone Encounter (Signed)
I called the patient. The insurance company will not cover Trokendi.  The patient has reduced her Topamax to 100 mg a day, if this is not improving her cognitive capacity, she will call me and we will reduce to 75 mg daily, consider adding propranolol in low dose.

## 2017-07-03 NOTE — Telephone Encounter (Signed)
Completed PA on covermymeds for rx Trokendi XR. Key: [id number]. Attached office notes with PA. PA denied d/t plan exclusion. Will ask MD if he wants to proceed with appeal letter.

## 2017-07-03 NOTE — Telephone Encounter (Signed)
I will write an appeal letter.

## 2017-07-03 NOTE — Telephone Encounter (Signed)
Error

## 2017-07-03 NOTE — Progress Notes (Signed)
Reason for visit: Migraine headache  Ana [name] is an 46 y.o. female  History of present illness:  Ana [name] is a 46 year old right-handed white female with a history of migraine headache. The patient is on Topamax, she was on 125 mg at night, but she has had cognitive side effects on this and she has dropped the dose back to 100 mg at night but she is still having problems functioning at work. The patient indicates that her headaches are less frequent, they may occur once every 4-6 weeks, but when they do come on they last for several days. The patient will take ibuprofen for the headache, Imitrex previously did not help. The patient indicates that weather changes and season such as the springtime will activate her headache. The patient is quite concerned about the cognitive side effects, she returns to this office for an evaluation.   Past Medical History:  Diagnosis Date  . Asthma    intermittent  . Borderline hyperlipidemia    No med trials in the past  . Common migraine with intractable migraine 11/14/2015  . Fibroids   . GERD (gastroesophageal reflux disease)    controlled with diet  . Headache 08/23/2015  . Migraine headache    about 2 per yr  . Seasonal allergies     Past Surgical History:  Procedure Laterality Date  . ABDOMINAL HYSTERECTOMY  08/31/2012   Procedure: HYSTERECTOMY ABDOMINAL;  Surgeon: Luz Lex, MD;  Location: [facility];  Service: Gynecology;  Laterality: N/A;  . CESAREAN SECTION  2005, 2007   x2  . DILATION AND CURETTAGE OF UTERUS     missed ab  . KNEE ARTHROSCOPY    . MYOMECTOMY    . TONSILLECTOMY    . WISDOM TOOTH EXTRACTION      Family History  Problem Relation Age of Onset  . Arthritis Mother   . Hypertension Mother   . Hyperlipidemia Father   . Hypertension Father   . Cancer Paternal Grandmother        breast  . Arthritis Maternal Grandmother   . Heart disease Paternal Grandfather   . Migraines Neg Hx     Social history:   reports that she has never smoked. She has never used smokeless tobacco. She reports that she does not drink alcohol or use drugs.    Allergies  Allergen Reactions  . Omnicef [Cefdinir] Other (See Comments)    GI upset  . Trazodone And Nefazodone Nausea And Vomiting    Medications:  Prior to Admission medications   Medication Sig Start Date End Date Taking? Authorizing Provider  cetirizine (ZYRTEC) 10 MG tablet Take 10 mg by mouth daily.   Yes [provider]  fluticasone (FLONASE) 50 MCG/ACT nasal spray INHALE 1 SPRAY IN EACH NOSTRIL TWICE A DAY USE LEFT HAND FOR RIGHT SIDE AND RIGHT FOR LEFT SIDE 08/05/16  Yes Silverio Decamp, MD  topiramate (TOPAMAX) 25 MG tablet TAKE 5 TABLETS AT NIGHT 05/19/17  Yes Kathrynn Ducking, MD  zolpidem (AMBIEN) 10 MG tablet TAKE 1 TABLET BY MOUTH AT BEDTIME AS NEEDED 04/03/17  Yes Silverio Decamp, MD    ROS:  Out of a complete 14 system review of symptoms, the patient complains only of the following symptoms, and all other reviewed systems are negative.  Memory loss  Blood pressure 112/71, pulse 81, height 5\' 4"  (1.626 m), weight 129 lb (58.5 kg), last menstrual period 07/30/2012.  Physical Exam  General: The patient is alert  and cooperative at the time of the examination.  Skin: No significant peripheral edema is noted.   Neurologic Exam  Mental status: The patient is alert and oriented x 3 at the time of the examination. The patient has apparent normal recent and remote memory, with an apparently normal attention span and concentration ability.   Cranial nerves: Facial symmetry is present. Speech is normal, no aphasia or dysarthria is noted. Extraocular movements are full. Visual fields are full.  Motor: The patient has good strength in all 4 extremities.  Sensory examination: Soft touch sensation is symmetric on the face, arms, and legs.  Coordination: The patient has good finger-nose-finger and heel-to-shin  bilaterally.  Gait and station: The patient has a normal gait. Tandem gait is normal. Romberg is negative. No drift is seen.  Reflexes: Deep tendon reflexes are symmetric.   Assessment/Plan:  1. Migraine headache  The patient is having cognitive side effects on Topamax, we will switch her to Trokendi taking 100 mg in the evening. When the headache does come on, we will try Amerge taking 2.5 mg twice daily for 3 days. The patient will follow-up in 6 months, she will call for any concerns. If the Trokendi is not tolerated, we may need to switch back to the short acting Topamax, reduce the dose, and add another medication for her migraine.  Jill Alexanders MD 07/03/2017 7:57 AM  [facility] [address] [place], [place] [address]  Phone [phone] Fax [phone]

## 2017-07-03 NOTE — Patient Instructions (Signed)
   We will go on Trokendi for the headache 100 mg at night.  We the headache comes on, take Amerge 2.5 mg tablets one twice a day for 3 days.

## 2017-08-25 ENCOUNTER — Telehealth: Payer: Self-pay | Admitting: Neurology

## 2017-08-25 MED ORDER — PREDNISONE 5 MG PO TABS: ORAL_TABLET | ORAL | 0 refills | Status: DC

## 2017-08-25 MED ORDER — NORTRIPTYLINE HCL 10 MG PO CAPS: ORAL_CAPSULE | ORAL | 3 refills | Status: DC

## 2017-08-25 NOTE — Telephone Encounter (Signed)
I [facility] the [facility] to [facility].

## 2017-08-25 NOTE — Telephone Encounter (Signed)
Patient calling back asking when [facility] will be called [facility].

## 2017-08-25 NOTE — Telephone Encounter (Signed)
Patient has had a really bad migraine for the last five days and would like a Rx for [facility]. She says topiramate (TOPAMAX) 25 MG tablet has not helped.

## 2017-08-25 NOTE — Addendum Note (Signed)
Addended by: Kathrynn Ducking on: 08/25/2017 05:07 PM   Modules accepted: Orders

## 2017-08-25 NOTE — Telephone Encounter (Signed)
I called the patient.  The patient is having almost daily headaches currently.  Headaches have worsened over the last 2 months.  Her current headache has been fairly severe over the last for 5 days but has been present for at least 10 days.  I will call in a prednisone Dosepak, I will get her started on nortriptyline.  The Topamax is not effective.

## 2017-10-01 ENCOUNTER — Other Ambulatory Visit: Payer: Self-pay | Admitting: Sports Medicine

## 2017-10-01 DIAGNOSIS — Z299 Encounter for prophylactic measures, unspecified: Secondary | ICD-10-CM

## 2017-10-29 ENCOUNTER — Other Ambulatory Visit: Payer: Self-pay | Admitting: Neurology

## 2017-10-29 NOTE — Telephone Encounter (Signed)
Called and LVM for pt to call office back to verify how she is taking her topamax. Refill request that came in was for old rx.  Current rx states she takes topamax 25mg  (4 tablets twice daily)

## 2017-10-30 NOTE — Telephone Encounter (Signed)
LVM again for patient to call re: topamax. Gave [facility] phone number.

## 2017-12-02 ENCOUNTER — Ambulatory Visit (INDEPENDENT_AMBULATORY_CARE_PROVIDER_SITE_OTHER): Payer: 59 | Admitting: Sports Medicine

## 2017-12-02 ENCOUNTER — Encounter: Payer: Self-pay | Admitting: Sports Medicine

## 2017-12-02 ENCOUNTER — Ambulatory Visit (INDEPENDENT_AMBULATORY_CARE_PROVIDER_SITE_OTHER): Payer: 59

## 2017-12-02 DIAGNOSIS — R1013 Epigastric pain: Secondary | ICD-10-CM | POA: Insufficient documentation

## 2017-12-02 DIAGNOSIS — K219 Gastro-esophageal reflux disease without esophagitis: Secondary | ICD-10-CM | POA: Insufficient documentation

## 2017-12-02 MED ORDER — SUCRALFATE 1 G PO TABS: 1.0000 g | ORAL_TABLET | Freq: Four times a day (QID) | ORAL | 0 refills | Status: DC

## 2017-12-02 MED ORDER — RANITIDINE HCL 300 MG PO TABS: 300.0000 mg | ORAL_TABLET | Freq: Two times a day (BID) | ORAL | 3 refills | Status: DC

## 2017-12-02 MED ORDER — GI COCKTAIL ~~LOC~~
30.0000 mL | Freq: Once | ORAL | Status: AC
  Administered 2017-12-02: 30 mL via ORAL

## 2017-12-02 NOTE — Addendum Note (Signed)
Addended by: Elizabeth Sauer on: 12/02/2017 04:43 PM   Modules accepted: Orders

## 2017-12-02 NOTE — Assessment & Plan Note (Signed)
Suspect hiatal hernia versus peptic ulcer disease. Improved with GI cocktail. Tenderness in the epigastrium, question GI bleed. Getting routine labs, starting ranitidine 300 twice a day, Carafate. Abdominal x-ray to evaluate for hiatal hernia Return to see me in 2 weeks.

## 2017-12-02 NOTE — Progress Notes (Signed)
Subjective:    CC: Feeling dizzy  HPI: This is a pleasant 47 year old female, for the past several days to weeks she has had increasing episodes of odd dizziness, lightheadedness.  Slight epigastric discomfort, nausea.  No melena, hematochezia, hematemesis, no respiratory symptoms, no urinary symptoms.  No changes in her hearing, vision.  She does note a significant burning feeling in her substernal region, no crushing or pressure chest pain, and no pain with physical exertion.  I reviewed the past medical history, family history, social history, surgical history, and allergies today and no changes were needed.  Please see the problem list section below in epic for further details.  Past Medical History: Past Medical History:  Diagnosis Date  . Asthma    intermittent  . Borderline hyperlipidemia    No med trials in the past  . Common migraine with intractable migraine 11/14/2015  . Fibroids   . GERD (gastroesophageal reflux disease)    controlled with diet  . Headache 08/23/2015  . Migraine headache    about 2 per yr  . Seasonal allergies    Past Surgical History: Past Surgical History:  Procedure Laterality Date  . ABDOMINAL HYSTERECTOMY  08/31/2012   Procedure: HYSTERECTOMY ABDOMINAL;  Surgeon: Luz Lex, MD;  Location: [facility];  Service: Gynecology;  Laterality: N/A;  . CESAREAN SECTION  2005, 2007   x2  . DILATION AND CURETTAGE OF UTERUS     missed ab  . KNEE ARTHROSCOPY    . MYOMECTOMY    . TONSILLECTOMY    . WISDOM TOOTH EXTRACTION     Social History: Social History   Socioeconomic History  . Marital status: Married    Spouse name: Matt  . Number of children: 2  . Years of education: College  . Highest education level: None  Social Needs  . Financial resource strain: None  . Food insecurity - worry: None  . Food insecurity - inability: None  . Transportation needs - medical: None  . Transportation needs - non-medical: None  Occupational History   Employer: [employer]  Tobacco Use  . Smoking status: Never Smoker  . Smokeless tobacco: Never Used  Substance and Sexual Activity  . Alcohol use: No    Alcohol/week: 0.0 oz    Comment: social 1 glass of wine monthly  . Drug use: No  . Sexual activity: None  Other Topics Concern  . None  Social History Narrative   Married.  Two kids.   College grad--[employer].   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   Caffeine Use: 1-2 cup daily   Patient is right handed.       Family History: Family History  Problem Relation Age of Onset  . Arthritis Mother   . Hypertension Mother   . Hyperlipidemia Father   . Hypertension Father   . Cancer Paternal Grandmother        breast  . Arthritis Maternal Grandmother   . Heart disease Paternal Grandfather   . Migraines Neg Hx    Allergies: Allergies  Allergen Reactions  . Omnicef [Cefdinir] Other (See Comments)    GI upset  . Trazodone And Nefazodone Nausea And Vomiting   Medications: See med rec.  Review of Systems: No fevers, chills, night sweats, weight loss, chest pain, or shortness of breath.   Objective:    General: Well Developed, well nourished, and in no acute distress.  Neuro: Alert and oriented x3, extra-ocular muscles intact, sensation grossly intact.  HEENT: Normocephalic, atraumatic, pupils equal round reactive to light, neck supple, no masses, no lymphadenopathy, thyroid nonpalpable.  Oropharynx, nasopharynx, ear canals unremarkable Skin: Warm and dry, no rashes. Cardiac: Regular rate and rhythm, no murmurs rubs or gallops, no lower extremity edema.  Respiratory: Clear to auscultation bilaterally. Not using accessory muscles, speaking in full sentences. Abdomen: Soft, tender to palpation in the epigastrium, nondistended.  No masses, no guarding, rigidity, rebound tenderness, no costovertebral angle pain.  Epigastric burning and substernal burning resolved with a GI cocktail.  Impression and  Recommendations:    Epigastric pain Suspect hiatal hernia versus peptic ulcer disease. Improved with GI cocktail. Tenderness in the epigastrium, question GI bleed. Getting routine labs, starting ranitidine 300 twice a day, Carafate. Abdominal x-ray to evaluate for hiatal hernia Return to see me in 2 weeks. ___________________________________________ Gwen Her. Dianah Field, M.D., ABFM., CAQSM. Primary Care and [place] Instructor of [place] [facility]

## 2017-12-02 NOTE — Patient Instructions (Signed)
Gastritis, Adult Gastritis is inflammation of the stomach. There are two kinds of gastritis:  Acute gastritis. This kind develops suddenly.  Chronic gastritis. This kind lasts for a long time.  Gastritis happens when the lining of the stomach becomes weak or gets damaged. Without treatment, gastritis can lead to stomach bleeding and ulcers. What are the causes? This condition may be caused by:  An infection.  Drinking too much alcohol.  Certain medicines.  Having too much acid in the stomach.  A disease of the intestines or stomach.  Stress.  What are the signs or symptoms? Symptoms of this condition include:  Pain or a burning in the upper abdomen.  Nausea.  Vomiting.  An uncomfortable feeling of fullness after eating.  In some cases, there are no symptoms. How is this diagnosed? This condition may be diagnosed with:  A description of your symptoms.  A physical exam.  Tests. These can include: ? Blood tests. ? Stool tests. ? A test in which a thin, flexible instrument with a light and camera on the end is passed down the esophagus and into the stomach (upper endoscopy). ? A test in which a sample of tissue is taken for testing (biopsy).  How is this treated? This condition may be treated with medicines. If the condition is caused by a bacterial infection, you may be given antibiotic medicines. If it is caused by too much acid in the stomach, you may get medicines called H2 blockers, proton pump inhibitors, or antacids. Treatment may also involve stopping the use of certain medicines, such as aspirin, ibuprofen, or other nonsteroidal anti-inflammatory drugs (NSAIDs). Follow these instructions at home:  Take over-the-counter and prescription medicines only as told by your health care provider.  If you were prescribed an antibiotic, take it as told by your health care provider. Do not stop taking the antibiotic even if you start to feel better.  Drink enough  fluid to keep your urine clear or pale yellow.  Eat small, frequent meals instead of large meals. Contact a health care provider if:  Your symptoms get worse.  Your symptoms return after treatment. Get help right away if:  You vomit blood or material that looks like coffee grounds.  You have black or dark red stools.  You are unable to keep fluids down.  Your abdominal pain gets worse.  You have a fever.  You do not feel better after 1 week. This information is not intended to replace advice given to you by your health care provider. Make sure you discuss any questions you have with your health care provider. Document Released: 09/24/2001 Document Revised: 05/29/2016 Document Reviewed: 06/24/2015 Elsevier Interactive Patient Education  2018 Elsevier Inc.  

## 2017-12-03 LAB — COMPREHENSIVE METABOLIC PANEL
AG Ratio: 2 (calc) (ref 1.0–2.5)
Albumin: 4.9 g/dL (ref 3.6–5.1)
Alkaline phosphatase (APISO): 51 U/L (ref 33–115)
BUN: 16 mg/dL (ref 7–25)
CO2: 23 mmol/L (ref 20–32)
Calcium: 9.4 mg/dL (ref 8.6–10.2)
Glucose, Bld: 89 mg/dL (ref 65–99)
Potassium: 3.4 mmol/L — ABNORMAL LOW (ref 3.5–5.3)
Sodium: 137 mmol/L (ref 135–146)
Total Bilirubin: 0.6 mg/dL (ref 0.2–1.2)
Total Protein: 7.4 g/dL (ref 6.1–8.1)

## 2017-12-03 LAB — CBC
HCT: 42.8 % (ref 35.0–45.0)
Hemoglobin: 14.6 g/dL (ref 11.7–15.5)
MCH: 29.6 pg (ref 27.0–33.0)
MCHC: 34.1 g/dL (ref 32.0–36.0)
MCV: 86.6 fL (ref 80.0–100.0)
MPV: 10.3 fL (ref 7.5–12.5)
Platelets: 251 10*3/uL (ref 140–400)
RBC: 4.94 10*6/uL (ref 3.80–5.10)
RDW: 11.9 % (ref 11.0–15.0)
WBC: 7.7 Thousand/uL (ref 3.8–10.8)

## 2017-12-03 LAB — TSH: TSH: 1.39 m[IU]/L

## 2017-12-03 LAB — COMPREHENSIVE METABOLIC PANEL WITH GFR
ALT: 23 U/L (ref 6–29)
AST: 19 U/L (ref 10–30)
Chloride: 107 mmol/L (ref 98–110)
Creat: 0.98 mg/dL (ref 0.50–1.10)
Globulin: 2.5 g/dL (ref 1.9–3.7)

## 2017-12-03 LAB — AMYLASE: Amylase: 27 U/L (ref 21–101)

## 2017-12-03 LAB — LIPASE: Lipase: 20 U/L (ref 7–60)

## 2017-12-04 ENCOUNTER — Ambulatory Visit: Payer: 59 | Admitting: Sports Medicine

## 2017-12-16 ENCOUNTER — Encounter: Payer: Self-pay | Admitting: Sports Medicine

## 2017-12-16 ENCOUNTER — Ambulatory Visit (INDEPENDENT_AMBULATORY_CARE_PROVIDER_SITE_OTHER): Payer: 59 | Admitting: Sports Medicine

## 2017-12-16 DIAGNOSIS — R1013 Epigastric pain: Secondary | ICD-10-CM | POA: Diagnosis not present

## 2017-12-16 NOTE — Addendum Note (Signed)
Addended by: Elizabeth Sauer on: 12/16/2017 05:01 PM   Modules accepted: Orders

## 2017-12-16 NOTE — Assessment & Plan Note (Signed)
Persistence of epigastric pain, KUB only showed some increased stool burden, no evidence of a hiatal hernia. She improved considerably with Carafate and ranitidine 300 twice a day. She has had some diarrhea with PPIs in the past though I told her that at some point we probably will need to use a PPI. She also responded extremely well to a GI cocktail given at the last visit. Urease breath test today. Referral to gastroenterology considering persistence of symptoms. Labs were unremarkable, no evidence of a GI bleed, no anemia. Stop carafate.

## 2017-12-16 NOTE — Progress Notes (Signed)
Subjective:    CC: Follow-up  HPI: This is a very pleasant 47 year old female, I saw her about a month ago with epigastric pain and burning, this responded extremely well and very quickly to a GI cocktail.  Ultimately we added ranitidine 300 twice a day, Carafate 4 times a day, check some routine labs all of which were negative.  Her symptoms have improved considerably but she still has some epigastric burning pain.  No melena, hematochezia, no constitutional symptoms, she does not use large amounts of ibuprofen, Aleve, or aspirin, she does not drink more than a cup of coffee per day, stress levels are fairly constant through the day but not worse than before.  She also has noted significant diarrhea when using PPIs in the past.  I reviewed the past medical history, family history, social history, surgical history, and allergies today and no changes were needed.  Please see the problem list section below in epic for further details.  Past Medical History: Past Medical History:  Diagnosis Date  . Asthma    intermittent  . Borderline hyperlipidemia    No med trials in the past  . Common migraine with intractable migraine 11/14/2015  . Fibroids   . GERD (gastroesophageal reflux disease)    controlled with diet  . Headache 08/23/2015  . Migraine headache    about 2 per yr  . Seasonal allergies    Past Surgical History: Past Surgical History:  Procedure Laterality Date  . ABDOMINAL HYSTERECTOMY  08/31/2012   Procedure: HYSTERECTOMY ABDOMINAL;  Surgeon: Luz Lex, MD;  Location: [facility];  Service: Gynecology;  Laterality: N/A;  . CESAREAN SECTION  2005, 2007   x2  . DILATION AND CURETTAGE OF UTERUS     missed ab  . KNEE ARTHROSCOPY    . MYOMECTOMY    . TONSILLECTOMY    . WISDOM TOOTH EXTRACTION     Social History: Social History   Socioeconomic History  . Marital status: Married    Spouse name: Matt  . Number of children: 2  . Years of education: College  . Highest  education level: None  Social Needs  . Financial resource strain: None  . Food insecurity - worry: None  . Food insecurity - inability: None  . Transportation needs - medical: None  . Transportation needs - non-medical: None  Occupational History    Employer: [employer]  Tobacco Use  . Smoking status: Never Smoker  . Smokeless tobacco: Never Used  Substance and Sexual Activity  . Alcohol use: No    Alcohol/week: 0.0 oz    Comment: social 1 glass of wine monthly  . Drug use: No  . Sexual activity: None  Other Topics Concern  . None  Social History Narrative   Married.  Two kids.   College grad--[place].   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   No T/A/Ds.   Caffeine Use: 1-2 cup daily   Patient is right handed.       Family History: Family History  Problem Relation Age of Onset  . Arthritis Mother   . Hypertension Mother   . Hyperlipidemia Father   . Hypertension Father   . Cancer Paternal Grandmother        breast  . Arthritis Maternal Grandmother   . Heart disease Paternal Grandfather   . Migraines Neg Hx    Allergies: Allergies  Allergen Reactions  . Omnicef [Cefdinir] Other (See Comments)    GI upset  . Trazodone And  Nefazodone Nausea And Vomiting   Medications: See med rec.  Review of Systems: No fevers, chills, night sweats, weight loss, chest pain, or shortness of breath.   Objective:    General: Well Developed, well nourished, and in no acute distress.  Neuro: Alert and oriented x3, extra-ocular muscles intact, sensation grossly intact.  HEENT: Normocephalic, atraumatic, pupils equal round reactive to light, neck supple, no masses, no lymphadenopathy, thyroid nonpalpable.  Skin: Warm and dry, no rashes. Cardiac: Regular rate and rhythm, no murmurs rubs or gallops, no lower extremity edema.  Respiratory: Clear to auscultation bilaterally. Not using accessory muscles, speaking in full sentences.  Impression and Recommendations:     Epigastric pain Persistence of epigastric pain, KUB only showed some increased stool burden, no evidence of a hiatal hernia. She improved considerably with Carafate and ranitidine 300 twice a day. She has had some diarrhea with PPIs in the past though I told her that at some point we probably will need to use a PPI. She also responded extremely well to a GI cocktail given at the last visit. Urease breath test today. Referral to gastroenterology considering persistence of symptoms. Labs were unremarkable, no evidence of a GI bleed, no anemia. Stop carafate.  ___________________________________________ Gwen Her. Dianah Field, M.D., ABFM., CAQSM. Primary Care and [place] Instructor of [place] of [facility] of Medicine

## 2017-12-18 LAB — H. PYLORI BREATH TEST: H. pylori Breath Test: NOT DETECTED

## 2017-12-29 ENCOUNTER — Other Ambulatory Visit: Payer: Self-pay | Admitting: Sports Medicine

## 2017-12-29 DIAGNOSIS — R1013 Epigastric pain: Secondary | ICD-10-CM

## 2017-12-29 DIAGNOSIS — Z299 Encounter for prophylactic measures, unspecified: Secondary | ICD-10-CM

## 2018-01-01 ENCOUNTER — Ambulatory Visit (INDEPENDENT_AMBULATORY_CARE_PROVIDER_SITE_OTHER): Payer: 59 | Admitting: Neurology

## 2018-01-01 ENCOUNTER — Encounter: Payer: Self-pay | Admitting: Neurology

## 2018-01-01 VITALS — BP 98/65 | HR 78 | Ht 63.5 in | Wt 129.5 lb

## 2018-01-01 DIAGNOSIS — G43019 Migraine without aura, intractable, without status migrainosus: Secondary | ICD-10-CM

## 2018-01-01 MED ORDER — DICLOFENAC POTASSIUM(MIGRAINE) 50 MG PO PACK: 50.0000 mg | PACK | Freq: Three times a day (TID) | ORAL | 2 refills | Status: DC | PRN

## 2018-01-01 MED ORDER — ERENUMAB-AOOE 70 MG/ML ~~LOC~~ SOAJ: 140.0000 mg | SUBCUTANEOUS | 4 refills | Status: DC

## 2018-01-01 NOTE — Progress Notes (Signed)
Reason for visit: Migraine headache   Ana [name] is an 47 y.o. female  History of present illness:  Ana [name] is a 47 year old right-handed white female with a history of chronic intractable migraine headache.  The patient indicates that her headaches are present about 10 days out of the month.  The headaches are not as severe as they had been on Topamax, but the patient cannot tolerate doses higher than 100 mg secondary to cognitive side effects.  The patient has had new symptoms of lightheaded sensations, not true vertigo.  The patient indicates that the episodes of dizziness may last up to 1 week, and may or may not be with her headaches.  The headaches themselves may last a week at a time.  The patient is not missing work because of the headache.  She indicates that weather changes are a significant activator for the migraine.  The patient has been given a prescription for nortriptyline, but she did not take the medication as she is concerned about cognitive side effects while working.  She was considered for propranolol therapy but her blood pressures generally run in the 42V systolic, and it is not clear that she could tolerate this therapy either.  She has taken Frova for the headache which was not effective.  She returns to this office for an evaluation.  Past Medical History:  Diagnosis Date  . Asthma    intermittent  . Borderline hyperlipidemia    No med trials in the past  . Common migraine with intractable migraine 11/14/2015  . Fibroids   . GERD (gastroesophageal reflux disease)    controlled with diet  . Headache 08/23/2015  . Migraine headache    about 2 per yr  . Seasonal allergies     Past Surgical History:  Procedure Laterality Date  . ABDOMINAL HYSTERECTOMY  08/31/2012   Procedure: HYSTERECTOMY ABDOMINAL;  Surgeon: Luz Lex, MD;  Location: [facility];  Service: Gynecology;  Laterality: N/A;  . CESAREAN SECTION  2005, 2007   x2  . DILATION AND CURETTAGE OF  UTERUS     missed ab  . KNEE ARTHROSCOPY    . MYOMECTOMY    . TONSILLECTOMY    . WISDOM TOOTH EXTRACTION      Family History  Problem Relation Age of Onset  . Arthritis Mother   . Hypertension Mother   . Hyperlipidemia Father   . Hypertension Father   . Cancer Paternal Grandmother        breast  . Arthritis Maternal Grandmother   . Heart disease Paternal Grandfather   . Migraines Neg Hx     Social history:  reports that she has never smoked. She has never used smokeless tobacco. She reports that she does not drink alcohol or use drugs.    Allergies  Allergen Reactions  . Omnicef [Cefdinir] Other (See Comments)    GI upset  . Trazodone And Nefazodone Nausea And Vomiting    Medications:  Prior to Admission medications   Medication Sig Start Date End Date Taking? Authorizing Provider  cetirizine (ZYRTEC) 10 MG tablet Take 10 mg by mouth daily.   Yes [provider]  fluticasone (FLONASE) 50 MCG/ACT nasal spray INHALE 1 SPRAY IN EACH NOSTRIL TWICE A DAY USE LEFT HAND FOR RIGHT SIDE AND RIGHT FOR LEFT SIDE Patient taking differently: INHALE 1 SPRAY IN EACH NOSTRIL TWICE A DAY as needed 08/05/16  Yes Silverio Decamp, MD  ranitidine (ZANTAC) 300 MG tablet Take 1  tablet (300 mg total) by mouth 2 (two) times daily. 12/02/17  Yes Silverio Decamp, MD  topiramate (TOPAMAX) 25 MG tablet Take 4 tablets (100 mg total) by mouth at bedtime. 11/03/17  Yes Kathrynn Ducking, MD  zolpidem (AMBIEN) 10 MG tablet TAKE 1 TABLET BY MOUTH EVERY DAY AT BEDTIME AS NEEDED 12/29/17  Yes Silverio Decamp, MD  naratriptan (AMERGE) 2.5 MG tablet Take 1 tablet (2.5 mg total) by mouth 2 (two) times daily as needed for migraine. Patient not taking: Reported on 01/01/2018 07/03/17   Kathrynn Ducking, MD    ROS:  Out of a complete 14 system review of symptoms, the patient complains only of the following symptoms, and all other reviewed systems are negative.  Dizziness  Blood  pressure 98/65, pulse 78, height 5' 3.5" (1.613 m), weight 129 lb 8 oz (58.7 kg), last menstrual period 07/30/2012.  Physical Exam  General: The patient is alert and cooperative at the time of the examination.  Skin: No significant peripheral edema is noted.   Neurologic Exam  Mental status: The patient is alert and oriented x 3 at the time of the examination. The patient has apparent normal recent and remote memory, with an apparently normal attention span and concentration ability.   Cranial nerves: Facial symmetry is present. Speech is normal, no aphasia or dysarthria is noted. Extraocular movements are full. Visual fields are full.  Motor: The patient has good strength in all 4 extremities.  Sensory examination: Soft touch sensation is symmetric on the face, arms, and legs.  Coordination: The patient has good finger-nose-finger and heel-to-shin bilaterally.  Gait and station: The patient has a normal gait. Tandem gait is normal. Romberg is negative. No drift is seen.  Reflexes: Deep tendon reflexes are symmetric.   Assessment/Plan:  1.  Migraine headache  2.  Dizziness  The patient will continue on Topamax.  She has been unable to go on doses higher than 100 mg of the Topamax.  Her insurance company would not cover the long-acting preparation such as Trokendi.  The patient is running low blood pressures, and cannot go on beta-blocker medications.  She was not willing to try nortriptyline for fear of cognitive side effects.  The patient will be placed on Aimovig.  She will be given a prescription for diclofenac potassium.  The patient will follow-up in 5 months.  Ana Alexanders MD 01/01/2018 8:13 AM  [facility] [address] [place], [place] [address]  Phone [phone] Fax [phone]

## 2018-01-01 NOTE — Patient Instructions (Signed)
We will stay on the Topamax, add Aimovig once a month.  Use diclofenac potassium if needed.

## 2018-01-02 ENCOUNTER — Telehealth: Payer: Self-pay | Admitting: *Deleted

## 2018-01-02 NOTE — Telephone Encounter (Signed)
Submitted PA Aimovig (dose: 140mg , 2-70mg /ml pens per 30 days). Key: [id number]. Waiting on determination.

## 2018-01-05 NOTE — Telephone Encounter (Signed)
Received fax notification from [facility] that [place] approved until 04/04/18. PA#: [id number].

## 2018-03-24 ENCOUNTER — Telehealth: Payer: Self-pay | Admitting: *Deleted

## 2018-03-24 MED ORDER — ERENUMAB-AOOE 140 MG/ML ~~LOC~~ SOAJ: 140.0000 mg | SUBCUTANEOUS | 3 refills | Status: DC

## 2018-03-24 NOTE — Telephone Encounter (Signed)
Aimovig escribed to [facility]

## 2018-03-26 NOTE — Telephone Encounter (Signed)
Pt called office back. She states she never started the [place]. Even though PA approved it was going to cost 700.00 per month. She did not notify our office.   I recommended she call me next time if this happens. Advised I am happy to help her work this out to see if there are other options to help make her medication more affordable. Advised I will try and get medication approved again. If it will be expensive, I will see if she has any other options to bring the cost down. She verbalized understanding and appreciation for call.

## 2018-03-26 NOTE — Telephone Encounter (Signed)
Tried calling insurance to complete PA but was transferred incorrectly 3 times to wrong department. Will try to complete next week.

## 2018-03-26 NOTE — Telephone Encounter (Signed)
Called, LVM for pt to call office. We are needing to do a PA for Aimovig. Wanting to know how she is doing on medication and if she has noticed improvement with her migraines. We need this information before submitting the PA.

## 2018-03-30 NOTE — Telephone Encounter (Signed)
Lennette Bihari from [employer] called to offer assistance if needed re: the PA that is needed for the [place].  Lennette Bihari from [place] [employer] can be reached at [phone] Ref VAN:[id number]

## 2018-03-31 ENCOUNTER — Other Ambulatory Visit: Payer: Self-pay | Admitting: Sports Medicine

## 2018-03-31 DIAGNOSIS — Z299 Encounter for prophylactic measures, unspecified: Secondary | ICD-10-CM

## 2018-03-31 NOTE — Telephone Encounter (Signed)
Lennette Bihari from [facility] called offering  assistance if needed regarding the PA for Aimovig. Can be reached at [phone] Ref [id number]

## 2018-03-31 NOTE — Telephone Encounter (Addendum)
Submitted PA on covermymeds. Waiting on determination. Key: [id number]  "[employer] is reviewing your PA request. Typically an electronic response will be received within 72 hours".   Pt has tried failed: frova, tylenol, ibuprofen, imitrex, amerge, topamax. Considered propranolol but her systolic BP in the 72'S.  Dx: chronic migraines ICD-10: P19.802

## 2018-03-31 NOTE — Telephone Encounter (Signed)
I called pt insurance and spoke with Juanda Crumble (provider services) who transferred me to pharmacy benefit line. They advised their system is down and unable to complete PA currently. Advised me to call back in 1-2 hours.

## 2018-04-07 NOTE — Telephone Encounter (Signed)
PA completed today also denied. Stating it was cancelled for the following reason: "Aimovig has been denied on 04/04/18. Please follow the appeals process outlined in the original denial letter".   I faxed completed signed appeal letter to [facility] appeals unit at [phone]. Received fax confirmation. Waiting on determination.

## 2018-04-07 NOTE — Telephone Encounter (Signed)
PA was denied d/t no positive response to therapy. I called [employer] and spoke with Lilia Pro. I explained letter attached to PA explained pt never started medication. She states a new PA would have to be completed.   I completed PA over the phone. It was pended to clinical pharmacist for review. Should have determination within 24hr. Waiting on determination.

## 2018-04-13 ENCOUNTER — Encounter: Payer: Self-pay | Admitting: Sports Medicine

## 2018-04-13 ENCOUNTER — Ambulatory Visit (INDEPENDENT_AMBULATORY_CARE_PROVIDER_SITE_OTHER): Payer: 59 | Admitting: Sports Medicine

## 2018-04-13 ENCOUNTER — Ambulatory Visit (INDEPENDENT_AMBULATORY_CARE_PROVIDER_SITE_OTHER): Payer: 59

## 2018-04-13 DIAGNOSIS — M4802 Spinal stenosis, cervical region: Secondary | ICD-10-CM

## 2018-04-13 DIAGNOSIS — M5412 Radiculopathy, cervical region: Secondary | ICD-10-CM

## 2018-04-13 DIAGNOSIS — M4184 Other forms of scoliosis, thoracic region: Secondary | ICD-10-CM | POA: Diagnosis not present

## 2018-04-13 DIAGNOSIS — J320 Chronic maxillary sinusitis: Secondary | ICD-10-CM | POA: Diagnosis not present

## 2018-04-13 DIAGNOSIS — M542 Cervicalgia: Secondary | ICD-10-CM | POA: Diagnosis not present

## 2018-04-13 DIAGNOSIS — M5030 Other cervical disc degeneration, unspecified cervical region: Secondary | ICD-10-CM | POA: Insufficient documentation

## 2018-04-13 MED ORDER — AMOXICILLIN-POT CLAVULANATE 875-125 MG PO TABS: 1.0000 | ORAL_TABLET | Freq: Two times a day (BID) | ORAL | 0 refills | Status: DC

## 2018-04-13 MED ORDER — PREDNISONE 50 MG PO TABS: 50.0000 mg | ORAL_TABLET | Freq: Every day | ORAL | 0 refills | Status: DC

## 2018-04-13 NOTE — Progress Notes (Signed)
Subjective:    CC: Sinus pain, back pain  HPI: This is a pleasant 47 year old female with acute on chronic maxillary sinusitis, recently had some left-sided sinus pain and pressure, 1 week duration with some left-sided nasal bloody drainage.  No fevers or chills, mild referral of pain to the teeth into the left ear.  In addition she is developed pain in her upper back with radiation around the left shoulder blade, and down the inside of the left upper arm to the elbow but not past.  Before this she did have pain in her neck with stiffness.  Moderate, persistent, no progressive weakness, no bowel or bladder dysfunction, saddle numbness, constitutional symptoms.  I reviewed the past medical history, family history, social history, surgical history, and allergies today and no changes were needed.  Please see the problem list section below in epic for further details.  Past Medical History: Past Medical History:  Diagnosis Date  . Asthma    intermittent  . Borderline hyperlipidemia    No med trials in the past  . Common migraine with intractable migraine 11/14/2015  . Fibroids   . GERD (gastroesophageal reflux disease)    controlled with diet  . Headache 08/23/2015  . Migraine headache    about 2 per yr  . Seasonal allergies    Past Surgical History: Past Surgical History:  Procedure Laterality Date  . ABDOMINAL HYSTERECTOMY  08/31/2012   Procedure: HYSTERECTOMY ABDOMINAL;  Surgeon: Luz Lex, MD;  Location: [facility];  Service: Gynecology;  Laterality: N/A;  . CESAREAN SECTION  2005, 2007   x2  . DILATION AND CURETTAGE OF UTERUS     missed ab  . KNEE ARTHROSCOPY    . MYOMECTOMY    . TONSILLECTOMY    . WISDOM TOOTH EXTRACTION     Social History: Social History   Socioeconomic History  . Marital status: Married    Spouse name: Matt  . Number of children: 2  . Years of education: College  . Highest education level: Not on file  Occupational History    Employer: [employer]  Social Needs  . Financial resource strain: Not on file  . Food insecurity:    Worry: Not on file    Inability: Not on file  . Transportation needs:    Medical: Not on file    Non-medical: Not on file  Tobacco Use  . Smoking status: Never Smoker  . Smokeless tobacco: Never Used  Substance and Sexual Activity  . Alcohol use: No    Alcohol/week: 0.0 oz    Comment: social 1 glass of wine monthly  . Drug use: No  . Sexual activity: Not on file  Lifestyle  . Physical activity:    Days per week: Not on file    Minutes per session: Not on file  . Stress: Not on file  Relationships  . Social connections:    Talks on phone: Not on file    Gets together: Not on file    Attends religious service: Not on file    Active member of club or organization: Not on file    Attends meetings of clubs or organizations: Not on file    Relationship status: Not on file  Other Topics Concern  . Not on file  Social History Narrative   Married.  Two kids.   College grad--[employer].   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   Caffeine Use: 1-2 cup daily  Patient is right handed.       Family History: Family History  Problem Relation Age of Onset  . Arthritis Mother   . Hypertension Mother   . Hyperlipidemia Father   . Hypertension Father   . Cancer Paternal Grandmother        breast  . Arthritis Maternal Grandmother   . Heart disease Paternal Grandfather   . Migraines Neg Hx    Allergies: Allergies  Allergen Reactions  . Omnicef [Cefdinir] Other (See Comments)    GI upset  . Trazodone And Nefazodone Nausea And Vomiting   Medications: See med rec.  Review of Systems: No fevers, chills, night sweats, weight loss, chest pain, or shortness of breath.   Objective:    General: Well Developed, well nourished, and in no acute distress.  Neuro: Alert and oriented x3, extra-ocular muscles intact, sensation grossly intact.  HEENT: Normocephalic, atraumatic,  pupils equal round reactive to light, neck supple, no masses, no lymphadenopathy, thyroid nonpalpable.  Tender to palpation over the left maxillary sinus, minimal bloody discharge deep in the nare. Skin: Warm and dry, no rashes. Cardiac: Regular rate and rhythm, no murmurs rubs or gallops, no lower extremity edema.  Respiratory: Clear to auscultation bilaterally. Not using accessory muscles, speaking in full sentences. Neck: Negative spurling's Full neck range of motion Grip strength and sensation normal in bilateral hands Strength good C4 to T1 distribution No sensory change to C4 to T1 Reflexes normal  Impression and Recommendations:    Sinusitis, chronic Acute on chronic left maxillary sinusitis. Augmentin, prednisone. There is some mild left-sided epistaxis so if no improvement or continued epistaxis at the follow-up visit we will proceed with ENT referral for direct visualization.  Radiculitis of left cervical region Left T1 distribution. Prednisone as above, cervical thoracic x-rays. Rehab exercises for cervical spondylosis and rhomboid strain. Return in 1 month, MR for interventional planning if no better. ___________________________________________ Gwen Her. Dianah Field, M.D., ABFM., CAQSM. Primary Care and [place] Instructor of [place] of [facility] of Medicine

## 2018-04-13 NOTE — Assessment & Plan Note (Signed)
Left T1 distribution. Prednisone as above, cervical thoracic x-rays. Rehab exercises for cervical spondylosis and rhomboid strain. Return in 1 month, MR for interventional planning if no better.

## 2018-04-13 NOTE — Assessment & Plan Note (Signed)
Acute on chronic left maxillary sinusitis. Augmentin, prednisone. There is some mild left-sided epistaxis so if no improvement or continued epistaxis at the follow-up visit we will proceed with ENT referral for direct visualization.

## 2018-04-14 ENCOUNTER — Other Ambulatory Visit: Payer: Self-pay | Admitting: Sports Medicine

## 2018-04-14 MED ORDER — AZITHROMYCIN 250 MG PO TABS: ORAL_TABLET | ORAL | 0 refills | Status: DC

## 2018-05-07 NOTE — Telephone Encounter (Signed)
I called [employer] to check on status of appeal. We have not received a notification. Appeal approved until 07/15/2018. Case ID: [id number]. They will fax our office the approval letter at [phone].

## 2018-05-15 ENCOUNTER — Other Ambulatory Visit: Payer: Self-pay | Admitting: Neurology

## 2018-06-09 ENCOUNTER — Ambulatory Visit: Payer: 59 | Admitting: Sports Medicine

## 2018-06-12 ENCOUNTER — Ambulatory Visit (INDEPENDENT_AMBULATORY_CARE_PROVIDER_SITE_OTHER): Payer: 59 | Admitting: Sports Medicine

## 2018-06-12 ENCOUNTER — Encounter: Payer: Self-pay | Admitting: Sports Medicine

## 2018-06-12 DIAGNOSIS — M5030 Other cervical disc degeneration, unspecified cervical region: Secondary | ICD-10-CM

## 2018-06-12 MED ORDER — MELOXICAM 15 MG PO TABS: ORAL_TABLET | ORAL | 3 refills | Status: DC

## 2018-06-12 NOTE — Progress Notes (Signed)
Subjective:    CC: Recurrence of neck pain  HPI: This is a pleasant 47 year old female, I saw her about 2 months ago with pain in the neck with radiation around the periscapular region on the left.  She did well with prednisone, and the tincture of time.  Unfortunately having recurrence of pain in her neck, loss of range of motion with radiation around her right scapula and down the right arm but not past the elbow.  No bowel or bladder dysfunction, saddle numbness, trauma, no constitutional symptoms, no progressive weakness.  I reviewed the past medical history, family history, social history, surgical history, and allergies today and no changes were needed.  Please see the problem list section below in epic for further details.  Past Medical History: Past Medical History:  Diagnosis Date  . Asthma    intermittent  . Borderline hyperlipidemia    No med trials in the past  . Common migraine with intractable migraine 11/14/2015  . Fibroids   . GERD (gastroesophageal reflux disease)    controlled with diet  . Headache 08/23/2015  . Migraine headache    about 2 per yr  . Seasonal allergies    Past Surgical History: Past Surgical History:  Procedure Laterality Date  . ABDOMINAL HYSTERECTOMY  08/31/2012   Procedure: HYSTERECTOMY ABDOMINAL;  Surgeon: Luz Lex, MD;  Location: [facility];  Service: Gynecology;  Laterality: N/A;  . CESAREAN SECTION  2005, 2007   x2  . DILATION AND CURETTAGE OF UTERUS     missed ab  . KNEE ARTHROSCOPY    . MYOMECTOMY    . TONSILLECTOMY    . WISDOM TOOTH EXTRACTION     Social History: Social History   Socioeconomic History  . Marital status: Married    Spouse name: Matt  . Number of children: 2  . Years of education: College  . Highest education level: Not on file  Occupational History    Employer: [employer]  Social Needs  . Financial resource strain: Not on file  . Food insecurity:    Worry: Not on file    Inability: Not on file  .  Transportation needs:    Medical: Not on file    Non-medical: Not on file  Tobacco Use  . Smoking status: Never Smoker  . Smokeless tobacco: Never Used  Substance and Sexual Activity  . Alcohol use: No    Alcohol/week: 0.0 standard drinks    Comment: social 1 glass of wine monthly  . Drug use: No  . Sexual activity: Not on file  Lifestyle  . Physical activity:    Days per week: Not on file    Minutes per session: Not on file  . Stress: Not on file  Relationships  . Social connections:    Talks on phone: Not on file    Gets together: Not on file    Attends religious service: Not on file    Active member of club or organization: Not on file    Attends meetings of clubs or organizations: Not on file    Relationship status: Not on file  Other Topics Concern  . Not on file  Social History Narrative   Married.  Two kids.   College grad--[employer] [employer].   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   No T/A/Ds.   Caffeine Use: 1-2 cup daily   Patient is right handed.       Family History: Family History  Problem Relation Age of Onset  .  Arthritis Mother   . Hypertension Mother   . Hyperlipidemia Father   . Hypertension Father   . Cancer Paternal Grandmother        breast  . Arthritis Maternal Grandmother   . Heart disease Paternal Grandfather   . Migraines Neg Hx    Allergies: Allergies  Allergen Reactions  . Omnicef [Cefdinir] Other (See Comments)    GI upset  . Trazodone And Nefazodone Nausea And Vomiting   Medications: See med rec.  Review of Systems: No fevers, chills, night sweats, weight loss, chest pain, or shortness of breath.   Objective:    General: Well Developed, well nourished, and in no acute distress.  Neuro: Alert and oriented x3, extra-ocular muscles intact, sensation grossly intact.  HEENT: Normocephalic, atraumatic, pupils equal round reactive to light, neck supple, no masses, no lymphadenopathy, thyroid nonpalpable.  Skin: Warm and dry, no  rashes. Cardiac: Regular rate and rhythm, no murmurs rubs or gallops, no lower extremity edema.  Respiratory: Clear to auscultation bilaterally. Not using accessory muscles, speaking in full sentences. Neck: Negative spurling's Full neck range of motion Grip strength and sensation normal in bilateral hands Strength good C4 to T1 distribution No sensory change to C4 to T1 Reflexes normal  Impression and Recommendations:    DDD (degenerative disc disease), cervical This time more right and left-sided periscapular symptoms. Adding formal physical therapy, meloxicam. Return in 4 weeks, MR for interventional planning if no better.  ___________________________________________ Gwen Her. Dianah Field, M.D., ABFM., CAQSM. Primary Care and [place] Instructor of [employer] [facility]

## 2018-06-12 NOTE — Assessment & Plan Note (Signed)
This time more right and left-sided periscapular symptoms. Adding formal physical therapy, meloxicam. Return in 4 weeks, MR for interventional planning if no better.

## 2018-06-28 ENCOUNTER — Other Ambulatory Visit: Payer: Self-pay | Admitting: Sports Medicine

## 2018-06-28 DIAGNOSIS — Z299 Encounter for prophylactic measures, unspecified: Secondary | ICD-10-CM

## 2018-07-09 ENCOUNTER — Ambulatory Visit: Payer: 59 | Admitting: Sports Medicine

## 2018-07-13 ENCOUNTER — Encounter: Payer: Self-pay | Admitting: Neurology

## 2018-07-13 ENCOUNTER — Ambulatory Visit (INDEPENDENT_AMBULATORY_CARE_PROVIDER_SITE_OTHER): Payer: 59 | Admitting: Neurology

## 2018-07-13 VITALS — BP 98/60 | HR 73 | Wt 132.5 lb

## 2018-07-13 DIAGNOSIS — G43019 Migraine without aura, intractable, without status migrainosus: Secondary | ICD-10-CM

## 2018-07-13 DIAGNOSIS — S0300XA Dislocation of jaw, unspecified side, initial encounter: Secondary | ICD-10-CM | POA: Insufficient documentation

## 2018-07-13 MED ORDER — GABAPENTIN 300 MG PO CAPS: ORAL_CAPSULE | ORAL | 3 refills | Status: DC

## 2018-07-13 NOTE — Patient Instructions (Signed)
We will start Mobic 15 mg daily for 6 weeks, and start gabapentin for the headache.  Neurontin (gabapentin) may result in drowsiness, ankle swelling, gait instability, or possibly dizziness. Please contact our office if significant side effects occur with this medication.

## 2018-07-13 NOTE — Progress Notes (Signed)
Reason for visit: Migraine headache  Ana [name] is an 47 y.o. female  History of present illness:  Ana [name] is a 47 year old right-handed white female with a history of intractable migraine headache.  The patient is on Topamax taking 100 mg daily, her headaches have significantly worsened recently but she believes that there has been a trigger associated with the left temporomandibular joint.  The patient has had some orthodontic adjustments, she claims that she had some TMJ dysfunction when she was much younger but has not had problems with this for years.  The patient over the last several months has had clicking and pain involving the left temporomandibular joint with eating, she has had to modify her diet.  The patient can feel the left temporomandibular joint dislocate.  She has difficulty opening her mouth all the way, when the joint dislocates, she has difficulty closing the mouth.  The pain in the left temporomandibular joint will spread and convert into a typical migraine headache.  Triptan medication will help the headache.  The patient is having virtually daily headaches.  She is not missing work because of this.  Previously, Aimovig was prescribed, but her insurance company would not cover this.  She remains on Topamax 100 mg daily.  The patient returns to this office for an evaluation.  Past Medical History:  Diagnosis Date  . Asthma    intermittent  . Borderline hyperlipidemia    No med trials in the past  . Common migraine with intractable migraine 11/14/2015  . Fibroids   . GERD (gastroesophageal reflux disease)    controlled with diet  . Headache 08/23/2015  . Migraine headache    about 2 per yr  . Seasonal allergies     Past Surgical History:  Procedure Laterality Date  . ABDOMINAL HYSTERECTOMY  08/31/2012   Procedure: HYSTERECTOMY ABDOMINAL;  Surgeon: Luz Lex, MD;  Location: [facility];  Service: Gynecology;  Laterality: N/A;  . CESAREAN SECTION  2005, 2007     x2  . DILATION AND CURETTAGE OF UTERUS     missed ab  . KNEE ARTHROSCOPY    . MYOMECTOMY    . TONSILLECTOMY    . WISDOM TOOTH EXTRACTION      Family History  Problem Relation Age of Onset  . Arthritis Mother   . Hypertension Mother   . Hyperlipidemia Father   . Hypertension Father   . Cancer Paternal Grandmother        breast  . Arthritis Maternal Grandmother   . Heart disease Paternal Grandfather   . Migraines Neg Hx     Social history:  reports that she has never smoked. She has never used smokeless tobacco. She reports that she does not drink alcohol or use drugs.    Allergies  Allergen Reactions  . Omnicef [Cefdinir] Other (See Comments)    GI upset  . Trazodone And Nefazodone Nausea And Vomiting    Medications:  Prior to Admission medications   Medication Sig Start Date End Date Taking? Authorizing Provider  cetirizine (ZYRTEC) 10 MG tablet Take 10 mg by mouth daily.    [provider]  fluticasone (FLONASE) 50 MCG/ACT nasal spray INHALE 1 SPRAY IN EACH NOSTRIL TWICE A DAY USE LEFT HAND FOR RIGHT SIDE AND RIGHT FOR LEFT SIDE Patient taking differently: INHALE 1 SPRAY IN EACH NOSTRIL TWICE A DAY as needed 08/05/16   Silverio Decamp, MD  meloxicam (MOBIC) 15 MG tablet One tab PO qAM with breakfast  for 2 weeks, then daily prn pain. 06/12/18   Silverio Decamp, MD  ranitidine (ZANTAC) 300 MG tablet Take 1 tablet (300 mg total) by mouth 2 (two) times daily. 12/02/17   Silverio Decamp, MD  topiramate (TOPAMAX) 25 MG tablet TAKE 4 TABLETS (100 MG TOTAL) BY MOUTH AT BEDTIME. 05/15/18   Kathrynn Ducking, MD  zolpidem (AMBIEN) 10 MG tablet TAKE 1 TABLET BY MOUTH EVERY DAY AT BEDTIME AS NEEDED 06/29/18   Silverio Decamp, MD    ROS:  Out of a complete 14 system review of symptoms, the patient complains only of the following symptoms, and all other reviewed systems are negative.  Environmental allergies Left jaw pain  Blood pressure 98/60,  pulse 73, weight 132 lb 8 oz (60.1 kg), last menstrual period 07/30/2012, SpO2 96 %.  Physical Exam  General: The patient is alert and cooperative at the time of the examination.  Skin: No significant peripheral edema is noted.   Neurologic Exam  Mental status: The patient is alert and oriented x 3 at the time of the examination. The patient has apparent normal recent and remote memory, with an apparently normal attention span and concentration ability.   Cranial nerves: Facial symmetry is present. Speech is normal, no aphasia or dysarthria is noted. Extraocular movements are full. Visual fields are full.  Motor: The patient has good strength in all 4 extremities.  Sensory examination: Soft touch sensation is symmetric on the face, arms, and legs.  Coordination: The patient has good finger-nose-finger and heel-to-shin bilaterally.  Gait and station: The patient has a normal gait. Tandem gait is normal. Romberg is negative. No drift is seen.  Reflexes: Deep tendon reflexes are symmetric.   Assessment/Plan:  1.  Left temporomandibular joint dysfunction  2.  Intractable migraine headache  We will have the patient go on Mobic 15 mg daily for 6 weeks.  The patient will be referred to Dr. Terence Lux for evaluation of the left temporomandibular joint issue.  The patient will be placed on gabapentin working up to 300 mg twice daily, she will call me if she tolerates the medication but needs a higher dose.  Otherwise she will follow-up in 6 months.  Jill Alexanders MD 07/13/2018 8:08 AM  [facility] [address] [place], [place] [address]  Phone [phone] Fax [phone]

## 2018-07-14 ENCOUNTER — Other Ambulatory Visit: Payer: Self-pay | Admitting: Neurology

## 2018-08-10 ENCOUNTER — Other Ambulatory Visit: Payer: Self-pay | Admitting: Neurology

## 2018-09-12 ENCOUNTER — Other Ambulatory Visit: Payer: Self-pay | Admitting: Neurology

## 2018-09-27 ENCOUNTER — Other Ambulatory Visit: Payer: Self-pay | Admitting: Sports Medicine

## 2018-09-27 DIAGNOSIS — Z299 Encounter for prophylactic measures, unspecified: Secondary | ICD-10-CM

## 2018-09-30 ENCOUNTER — Other Ambulatory Visit: Payer: Self-pay | Admitting: Sports Medicine

## 2018-09-30 DIAGNOSIS — M5030 Other cervical disc degeneration, unspecified cervical region: Secondary | ICD-10-CM

## 2018-10-04 ENCOUNTER — Other Ambulatory Visit: Payer: Self-pay | Admitting: Neurology

## 2018-11-09 ENCOUNTER — Other Ambulatory Visit: Payer: Self-pay | Admitting: Neurology

## 2018-12-07 ENCOUNTER — Other Ambulatory Visit: Payer: Self-pay | Admitting: Neurology

## 2019-01-03 ENCOUNTER — Other Ambulatory Visit: Payer: Self-pay | Admitting: Sports Medicine

## 2019-01-03 DIAGNOSIS — Z299 Encounter for prophylactic measures, unspecified: Secondary | ICD-10-CM

## 2019-02-03 ENCOUNTER — Telehealth: Payer: Self-pay

## 2019-02-03 NOTE — Telephone Encounter (Signed)
Due to current COVID 19 pandemic, our office is severely reducing in office visits for at least the next 2 weeks, in order to minimize the risk to our patients and healthcare providers.   I called pt to offer her a virtual visit with Dr. Jannifer Franklin at her appt on 02/09/19. No answer, left a message asking her to call me back. If pt calls back, please discuss this with her and obtain consent.

## 2019-02-05 NOTE — Telephone Encounter (Signed)
Pt consented to a Virtual Visit. Email has been added to the chart.  Pt understands that although there may be some limitations with this type of visit, we will take all precautions to reduce any security or privacy concerns.  Pt understands that this will be treated like an in office visit and we will file with pt's insurance, and there may be a patient responsible charge related to this service.

## 2019-02-08 NOTE — Telephone Encounter (Signed)
LVM for pt to return my call so we could complete pre charting for 02/09/19 visit.

## 2019-02-08 NOTE — Telephone Encounter (Signed)
E-mail link has been provided to the pt.

## 2019-02-09 ENCOUNTER — Other Ambulatory Visit: Payer: Self-pay

## 2019-02-09 ENCOUNTER — Encounter: Payer: Self-pay | Admitting: Neurology

## 2019-02-09 ENCOUNTER — Ambulatory Visit (INDEPENDENT_AMBULATORY_CARE_PROVIDER_SITE_OTHER): Payer: 59 | Admitting: Neurology

## 2019-02-09 DIAGNOSIS — S0300XA Dislocation of jaw, unspecified side, initial encounter: Secondary | ICD-10-CM

## 2019-02-09 DIAGNOSIS — G43019 Migraine without aura, intractable, without status migrainosus: Secondary | ICD-10-CM

## 2019-02-09 MED ORDER — DICLOFENAC POTASSIUM 50 MG PO TABS: 50.0000 mg | ORAL_TABLET | Freq: Three times a day (TID) | ORAL | 3 refills | Status: DC | PRN

## 2019-02-09 MED ORDER — TOPIRAMATE 100 MG PO TABS: 100.0000 mg | ORAL_TABLET | Freq: Every day | ORAL | 3 refills | Status: DC

## 2019-02-09 MED ORDER — GABAPENTIN 300 MG PO CAPS: ORAL_CAPSULE | ORAL | 1 refills | Status: DC

## 2019-02-09 NOTE — Progress Notes (Signed)
     Virtual Visit via Telephone Note  I connected with Ana [name] on 02/09/19 at  8:00 AM EDT by telephone and verified that I am speaking with the correct person using two identifiers.   I discussed the limitations, risks, security and privacy concerns of performing an evaluation and management service by telephone and the availability of in person appointments. I also discussed with the patient that there may be a patient responsible charge related to this service. The patient expressed understanding and agreed to proceed.  The patient is at home, physician in the office.   History of Present Illness: Ana [name] is a 48 year old right-handed white female with a history of intractable migraine headache.  The patient claims that her headaches come in clusters, and may last 3 to 5 days with each cluster.  She may go several weeks without migraine.  She is on Amerge, but she can only get 4 tablets a month through her insurance company.  In the past, we have tried to get her on Aimovig and her insurance company would not cover this.  She remains on Topamax 100 mg at night.  She takes gabapentin 300 mg twice daily, she tolerates this medication well.  The patient may have some nausea with the headaches, she does not need a medication for nausea.  She notes that weather changes and certain odors may bring on headache.  She is not missing work because of the headache.  Overall, she is having less problems with her TMJ problem, she dislocates the left TMJ.  The patient was referred to Dr. Terence Lux, but she never saw him.  She manages her TMJ problem by modifying her diet and preventing herself from opening her mouth wide.   Observations/Objective: On the WebEx evaluation, the patient is bright and alert, she has normal speech pattern, no aphasia or dysarthria is noted.  Extraocular movements are full.  Facial symmetry is present.  She is answering questions appropriately.  Assessment and Plan: 1.   Intractable migraine headache  2.  TMJ dysfunction, left-sided dislocation  The patient will be increased on the gabapentin taking 300 mg the morning and 600 mg in the evening.  She will be continued on the Topamax, a prescription was sent in.  She will be placed on diclofenac potassium to take along with the Amerge for the migraine.  In the past, she did not wish to go on nortriptyline for fear of cognitive side effects while at work.  We may try getting her on Aimovig in the future.  She will follow-up in 6 months.  Follow Up Instructions: 30-month follow-up, may see nurse practitioner.   I discussed the assessment and treatment plan with the patient. The patient was provided an opportunity to ask questions and all were answered. The patient agreed with the plan and demonstrated an understanding of the instructions.   The patient was advised to call back or seek an in-person evaluation if the symptoms worsen or if the condition fails to improve as anticipated.  I provided 25 minutes of non-face-to-face time during this encounter.   Kathrynn Ducking, MD

## 2019-02-10 ENCOUNTER — Telehealth: Payer: Self-pay | Admitting: *Deleted

## 2019-02-10 NOTE — Telephone Encounter (Signed)
Message left requesting patient call to schedule a 6 month f/u wit Butler Denmark, NP.

## 2019-04-01 ENCOUNTER — Other Ambulatory Visit: Payer: Self-pay | Admitting: Sports Medicine

## 2019-04-01 DIAGNOSIS — Z299 Encounter for prophylactic measures, unspecified: Secondary | ICD-10-CM

## 2019-04-06 ENCOUNTER — Telehealth: Payer: Self-pay | Admitting: Neurology

## 2019-04-06 MED ORDER — DEXAMETHASONE 2 MG PO TABS: ORAL_TABLET | ORAL | 0 refills | Status: DC

## 2019-04-06 NOTE — Telephone Encounter (Signed)
I called the patient, we will start a 3-day course of Decadron for her headache.  Her headaches tend to last 3 to 5 days.

## 2019-04-06 NOTE — Telephone Encounter (Signed)
Pt is calling in wanting to know if she can get a script for Prednisone to break the cycle of Migraines she is having

## 2019-07-31 ENCOUNTER — Other Ambulatory Visit: Payer: Self-pay | Admitting: Sports Medicine

## 2019-07-31 DIAGNOSIS — Z299 Encounter for prophylactic measures, unspecified: Secondary | ICD-10-CM

## 2019-08-02 ENCOUNTER — Other Ambulatory Visit: Payer: Self-pay | Admitting: *Deleted

## 2019-08-02 DIAGNOSIS — Z299 Encounter for prophylactic measures, unspecified: Secondary | ICD-10-CM

## 2019-08-02 MED ORDER — ZOLPIDEM TARTRATE 10 MG PO TABS: ORAL_TABLET | ORAL | 3 refills | Status: DC

## 2019-09-26 ENCOUNTER — Emergency Department (INDEPENDENT_AMBULATORY_CARE_PROVIDER_SITE_OTHER)
Admission: EM | Admit: 2019-09-26 | Discharge: 2019-09-26 | Disposition: A | Discharge status: Home / Self Care | Payer: 59 | Source: Home / Self Care

## 2019-09-26 ENCOUNTER — Other Ambulatory Visit: Payer: Self-pay

## 2019-09-26 DIAGNOSIS — R05 Cough: Secondary | ICD-10-CM

## 2019-09-26 DIAGNOSIS — R059 Cough, unspecified: Secondary | ICD-10-CM

## 2019-09-26 DIAGNOSIS — J069 Acute upper respiratory infection, unspecified: Secondary | ICD-10-CM

## 2019-09-26 MED ORDER — ALBUTEROL SULFATE HFA 108 (90 BASE) MCG/ACT IN AERS: 2.0000 | INHALATION_SPRAY | Freq: Four times a day (QID) | RESPIRATORY_TRACT | 1 refills | Status: DC | PRN

## 2019-09-27 LAB — POC SARS CORONAVIRUS 2 AG -  ED: SARS Coronavirus 2 Ag: NEGATIVE

## 2019-09-27 NOTE — ED Provider Notes (Signed)
[facility]    CSN: [id number] Arrival date & time: 09/26/19  1342      History   Chief Complaint No chief complaint on file.   HPI Ana [name] is a 48 y.o. female.   Pt concerned about covid.  Pt has had some chest tightness.  Pt has asthma but does not have an inhaler   The history is provided by the patient. No language interpreter was used.  Cough Cough characteristics:  Non-productive Sputum characteristics:  Nondescript Severity:  Mild Timing:  Constant Progression:  Worsening Chronicity:  New Smoker: no   Relieved by:  Nothing Worsened by:  Nothing Associated symptoms: no shortness of breath and no wheezing     Past Medical History:  Diagnosis Date  . Asthma    intermittent  . Borderline hyperlipidemia    No med trials in the past  . Common migraine with intractable migraine 11/14/2015  . Fibroids   . GERD (gastroesophageal reflux disease)    controlled with diet  . Headache 08/23/2015  . Migraine headache    about 2 per yr  . Seasonal allergies     Patient Active Problem List   Diagnosis Date Noted  . TMJ (dislocation of temporomandibular joint) 07/13/2018  . DDD (degenerative disc disease), cervical 04/13/2018  . Epigastric pain 12/02/2017  . Common migraine with intractable migraine 11/14/2015  . Sinusitis, chronic 07/24/2015  . Diastasis recti 03/20/2015  . Lyme disease 01/23/2015  . 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 Ganglion cyst of left foot 04/28/2014  . Hyperlipidemia 04/28/2014  . Preventive measure 04/28/2014  . Insomnia 12/06/2012    Past Surgical History:  Procedure Laterality Date  . ABDOMINAL HYSTERECTOMY  08/31/2012   Procedure: HYSTERECTOMY ABDOMINAL;  Surgeon: Luz Lex, MD;  Location: [facility];  Service: Gynecology;  Laterality: N/A;  . CESAREAN SECTION  2005, 2007   x2  . DILATION AND CURETTAGE OF UTERUS     missed ab  . KNEE ARTHROSCOPY    . MYOMECTOMY    . TONSILLECTOMY    . WISDOM TOOTH  EXTRACTION      OB History   No obstetric history on file.      Home Medications    Prior to Admission medications   Medication Sig Start Date End Date Taking? Authorizing Provider  albuterol (VENTOLIN HFA) 108 (90 Base) MCG/ACT inhaler Inhale 2 puffs into the lungs every 6 (six) hours as needed for wheezing or shortness of breath. 09/26/19   Fransico Meadow, PA-C  cetirizine (ZYRTEC) 10 MG tablet Take 10 mg by mouth daily.    [provider]  dexamethasone (DECADRON) 2 MG tablet Take 3 tablets the first day, 2 the second and 1 the third day 04/06/19   Kathrynn Ducking, MD  diclofenac (CATAFLAM) 50 MG tablet Take 1 tablet (50 mg total) by mouth 3 (three) times daily as needed. 02/09/19   Kathrynn Ducking, MD  fluticasone (FLONASE) 50 MCG/ACT nasal spray INHALE 1 SPRAY IN EACH NOSTRIL TWICE A DAY USE LEFT HAND FOR RIGHT SIDE AND RIGHT FOR LEFT SIDE Patient taking differently: INHALE 1 SPRAY IN EACH NOSTRIL TWICE A DAY as needed 08/05/16   Silverio Decamp, MD  gabapentin (NEURONTIN) 300 MG capsule 1 capsule in the morning, 2 in the evening 02/09/19   Kathrynn Ducking, MD  meloxicam (MOBIC) 15 MG tablet TAKE 1 TABLET BY MOUTH IN THE MORNING WITH BREAKFAST X2 WEEKS THEN DAILY AS NEEDED FOR PAIN 09/30/18  Silverio Decamp, MD  naratriptan (AMERGE) 2.5 MG tablet TAKE 1 TABLET (2.5 MG TOTAL) BY MOUTH 2 (TWO) TIMES DAILY AS NEEDED FOR MIGRAINE. 10/05/18   Kathrynn Ducking, MD  ranitidine (ZANTAC) 300 MG tablet Take 1 tablet (300 mg total) by mouth 2 (two) times daily. 12/02/17   Silverio Decamp, MD  topiramate (TOPAMAX) 100 MG tablet Take 1 tablet (100 mg total) by mouth at bedtime. 02/09/19   Kathrynn Ducking, MD  zolpidem (AMBIEN) 10 MG tablet TAKE 1 TABLET BY MOUTH EVERY DAY AT BEDTIME AS NEEDED 08/02/19   Silverio Decamp, MD    Family History Family History  Problem Relation Age of Onset  . Arthritis Mother   . Hypertension Mother   . Hyperlipidemia  Father   . Hypertension Father   . Cancer Paternal Grandmother        breast  . Arthritis Maternal Grandmother   . Heart disease Paternal Grandfather   . Migraines Neg Hx     Social History Social History   Tobacco Use  . Smoking status: Never Smoker  . Smokeless tobacco: Never Used  Substance Use Topics  . Alcohol use: No    Alcohol/week: 0.0 standard drinks    Comment: social 1 glass of wine monthly  . Drug use: No     Allergies   Omnicef [cefdinir] and Trazodone and nefazodone   Review of Systems Review of Systems  Respiratory: Positive for cough. Negative for shortness of breath and wheezing.   All other systems reviewed and are negative.    Physical Exam Triage Vital Signs ED Triage Vitals [09/26/19 1557]  Enc Vitals Group     BP      Pulse      Resp      Temp      Temp src      SpO2      Weight      Height      Head Circumference      Peak Flow      Pain Score 0     Pain Loc      Pain Edu?      Excl. in [place]?    No data found.  Updated Vital Signs LMP 07/30/2012   Visual Acuity Right Eye Distance:   Left Eye Distance:   Bilateral Distance:    Right Eye Near:   Left Eye Near:    Bilateral Near:     Physical Exam Vitals and nursing note reviewed.  Constitutional:      Appearance: She is well-developed.  HENT:     Head: Normocephalic.  Cardiovascular:     Rate and Rhythm: Normal rate and regular rhythm.  Pulmonary:     Effort: Pulmonary effort is normal.  Abdominal:     General: There is no distension.  Musculoskeletal:        General: Normal range of motion.     Cervical back: Normal range of motion.  Skin:    General: Skin is warm.  Neurological:     Mental Status: She is alert and oriented to person, place, and time.  Psychiatric:        Mood and Affect: Mood normal.      UC Treatments / Results  Labs (all labs ordered are listed, but only abnormal results are displayed) Labs Reviewed  SARS-COV-2 RNA,(COVID-19)  QUALITATIVE NAAT  POC SARS CORONAVIRUS 2 AG -  ED    EKG   Radiology No results found.  Procedures Procedures (including critical care time)  Medications Ordered in UC Medications - No data to display  Initial Impression / Assessment and Plan / UC Course  I have reviewed the triage vital signs and the nursing notes.  Pertinent labs & imaging results that were available during my care of the patient were reviewed by me and considered in my medical decision making (see chart for details).     MDM  Rapid covid negative, Send out pending  Final Clinical Impressions(s) / UC Diagnoses   Final diagnoses:  Cough  Acute upper respiratory infection   Discharge Instructions   None    ED Prescriptions    Medication Sig Dispense Auth. Provider   albuterol (VENTOLIN HFA) 108 (90 Base) MCG/ACT inhaler Inhale 2 puffs into the lungs every 6 (six) hours as needed for wheezing or shortness of breath. 1 g Fransico Meadow, [place]     PDMP not reviewed this encounter.  An After Visit Summary was printed and given to the patient.    Fransico Meadow, [place] 09/27/19 1744

## 2019-09-29 LAB — SARS-COV-2 RNA,(COVID-19) QUALITATIVE NAAT: SARS CoV2 RNA: NOT DETECTED

## 2019-10-11 ENCOUNTER — Encounter: Payer: Self-pay | Admitting: Neurology

## 2019-10-11 ENCOUNTER — Telehealth (INDEPENDENT_AMBULATORY_CARE_PROVIDER_SITE_OTHER): Payer: 59 | Admitting: Neurology

## 2019-10-11 DIAGNOSIS — G43019 Migraine without aura, intractable, without status migrainosus: Secondary | ICD-10-CM

## 2019-10-11 MED ORDER — TOPIRAMATE 100 MG PO TABS: 100.0000 mg | ORAL_TABLET | Freq: Every day | ORAL | 3 refills | Status: DC

## 2019-10-11 MED ORDER — NARATRIPTAN HCL 2.5 MG PO TABS: 2.5000 mg | ORAL_TABLET | Freq: Two times a day (BID) | ORAL | 6 refills | Status: DC | PRN

## 2019-10-11 MED ORDER — GABAPENTIN 300 MG PO CAPS: ORAL_CAPSULE | ORAL | 3 refills | Status: DC

## 2019-10-11 NOTE — Progress Notes (Signed)
Virtual Visit via Video Note  I connected with Ana [name] on 10/11/19 at  7:45 AM EST by a video enabled telemedicine application and verified that I am speaking with the correct person using two identifiers.  Location: Patient: At her home Provider: In the office    I discussed the limitations of evaluation and management by telemedicine and the availability of in person appointments. The patient expressed understanding and agreed to proceed.  History of Present Illness: 10/11/2019 SS: Ana [name] is a 49 year old female with history of intractable migraine headache.  Her headaches come in clusters, may last 3 to 5 days.  She remains on Topamax and gabapentin.  She has history of TMJ dysfunction. In June, she was given a 3-day Decadron taper for prolonged headache.  She takes naratriptan with excellent benefit, but her insurance company only allows 4 tablets a month.  She was prescribed diclofenac potassium at last visit, but has not tried the medication.  She was unable to tolerate the increase in gabapentin, due to side effect.  She is taking gabapentin 600 mg at bedtime, Topamax 100 mg at bedtime.  She says her headaches have remained stable.  There are some months she does very well, where she just has a minor headache, will take Motrin.  She has noticed that weather changes are trigger for the headaches.  She is able to discern when a significant headache is coming on, for that she will take naratriptan.  For all her headaches, she is able to push through, continue her daily activities.  Over time, she has learned to manage her headaches.  She says her TMJ dysfunction is under good control.  She denies any changes to her overall health.  She presents today for evaluation via virtual visit.  02/09/2019 Dr. Eugenie Birks: Ana [name] is a 48 year old right-handed white female with a history of intractable migraine headache.  The patient claims that her headaches come in clusters, and may last 3  to 5 days with each cluster.  She may go several weeks without migraine.  She is on Amerge, but she can only get 4 tablets a month through her insurance company.  In the past, we have tried to get her on Aimovig and her insurance company would not cover this.  She remains on Topamax 100 mg at night.  She takes gabapentin 300 mg twice daily, she tolerates this medication well.  The patient may have some nausea with the headaches, she does not need a medication for nausea.  She notes that weather changes and certain odors may bring on headache.  She is not missing work because of the headache.  Overall, she is having less problems with her TMJ problem, she dislocates the left TMJ.  The patient was referred to Dr. Terence Lux, but she never saw him.  She manages her TMJ problem by modifying her diet and preventing herself from opening her mouth wide.   Observations/Objective: Virtual visit, is alert and oriented, speech is clear and concise, follows commands, facial symmetry noted, no arm drift, gait is intact  Assessment and Plan: 1. Migraine headache  2. TMJ dysfunction, left-sided dislocation  Overall, her headaches have remained stable, she continues to have good and bad months.  Her current medications are working well.  She is unable to tolerate higher doses of gabapentin or Topamax.  She has yet to try diclofenac potassium.  She will remain on gabapentin 600 mg at bedtime, Topamax 100 mg at bedtime, naratriptan as needed.  In the future, we may try to get Aimovig covered again if her headache frequency increases.  She will follow-up in 8 months or sooner if needed.  I have sent refills on her medications.  Follow Up Instructions: 8 months, 06/14/2020 745 am   I discussed the assessment and treatment plan with the patient. The patient was provided an opportunity to ask questions and all were answered. The patient agreed with the plan and demonstrated an understanding of the instructions.   The patient  was advised to call back or seek an in-person evaluation if the symptoms worsen or if the condition fails to improve as anticipated.  I provided 15 minutes of non-face-to-face time during this encounter.  Evangeline Dakin, DNP  [facility] [address] [place], [place] [address] [phone]

## 2019-10-12 NOTE — Progress Notes (Signed)
I have read the note, and I agree with the clinical assessment and plan.  Annaliah Rivenbark K Reiko Vinje   

## 2019-11-29 ENCOUNTER — Other Ambulatory Visit: Payer: Self-pay | Admitting: Sports Medicine

## 2019-11-29 DIAGNOSIS — Z299 Encounter for prophylactic measures, unspecified: Secondary | ICD-10-CM

## 2019-11-30 ENCOUNTER — Telehealth: Payer: Self-pay | Admitting: *Deleted

## 2019-11-30 DIAGNOSIS — Z299 Encounter for prophylactic measures, unspecified: Secondary | ICD-10-CM

## 2019-11-30 NOTE — Telephone Encounter (Signed)
Pt called the office because you denied her refill of Ambien.  I explained to her that it was because she is way overdue for an appointment.  She was totally cool with coming in so I transferred her to scheduling but she wanted to know if you could at lease just send her some in to bridge her to her appointment.  Please advise.

## 2019-12-01 MED ORDER — ZOLPIDEM TARTRATE 10 MG PO TABS: ORAL_TABLET | ORAL | 3 refills | Status: DC

## 2019-12-01 NOTE — Telephone Encounter (Signed)
No problem, I will do a refill but she really needs to come in to get caught up on her preventive measures.  Thank you.

## 2019-12-01 NOTE — Telephone Encounter (Signed)
Pt notified of refill

## 2019-12-07 ENCOUNTER — Ambulatory Visit (INDEPENDENT_AMBULATORY_CARE_PROVIDER_SITE_OTHER): Payer: 59 | Admitting: Sports Medicine

## 2019-12-07 ENCOUNTER — Other Ambulatory Visit: Payer: Self-pay

## 2019-12-07 ENCOUNTER — Encounter: Payer: Self-pay | Admitting: Sports Medicine

## 2019-12-07 DIAGNOSIS — R635 Abnormal weight gain: Secondary | ICD-10-CM

## 2019-12-07 DIAGNOSIS — E7800 Pure hypercholesterolemia, unspecified: Secondary | ICD-10-CM

## 2019-12-07 DIAGNOSIS — Z299 Encounter for prophylactic measures, unspecified: Secondary | ICD-10-CM | POA: Diagnosis not present

## 2019-12-07 MED ORDER — PHENTERMINE HCL 37.5 MG PO TABS: ORAL_TABLET | ORAL | 0 refills | Status: DC

## 2019-12-07 NOTE — Assessment & Plan Note (Signed)
Kyndahl returns, rechecking lipids.

## 2019-12-07 NOTE — Progress Notes (Signed)
    Procedures performed today:    None.  Independent interpretation of tests performed by another provider:   None.  Impression and Recommendations:    Hyperlipidemia Ana [name] returns, rechecking lipids.  Abnormal weight gain Pandemic Ana [name] has also gained about 10 pounds, she did really well with phentermine in the past, and has lost a great deal of weight. She would like to try this again, we are going to give her a jumpstart on weight loss with 2 months of phentermine, she will return monthly for weight checks and vital sign checks. She will also plan to increase her exercise as well.    ___________________________________________ Gwen Her. Dianah Field, M.D., ABFM., CAQSM. Primary Care and [place] Instructor of [place] of [facility] of Medicine

## 2019-12-07 NOTE — Assessment & Plan Note (Signed)
Ana [name] has also gained about 10 pounds, she did really well with phentermine in the past, and has lost a great deal of weight. She would like to try this again, we are going to give her a jumpstart on weight loss with 2 months of phentermine, she will return monthly for weight checks and vital sign checks. She will also plan to increase her exercise as well.

## 2019-12-18 LAB — CBC
HCT: 42.2 % (ref 35.0–45.0)
Hemoglobin: 14.6 g/dL (ref 11.7–15.5)
MCH: 29.7 pg (ref 27.0–33.0)
MCHC: 34.6 g/dL (ref 32.0–36.0)
MCV: 85.9 fL (ref 80.0–100.0)
MPV: 10.3 fL (ref 7.5–12.5)
Platelets: 241 10*3/uL (ref 140–400)
RBC: 4.91 10*6/uL (ref 3.80–5.10)
RDW: 12.5 % (ref 11.0–15.0)
WBC: 6 10*3/uL (ref 3.8–10.8)

## 2019-12-18 LAB — LIPID PANEL W/REFLEX DIRECT LDL
Cholesterol: 213 mg/dL — ABNORMAL HIGH (ref <200)
HDL: 67 mg/dL (ref >=50)
LDL Cholesterol (Calc): 129 mg/dL (calc) — ABNORMAL HIGH
Non-HDL Cholesterol (Calc): 146 mg/dL (calc) — ABNORMAL HIGH (ref <130)
Total CHOL/HDL Ratio: 3.2 (calc) (ref <5.0)
Triglycerides: 74 mg/dL (ref <150)

## 2019-12-18 LAB — COMPLETE METABOLIC PANEL WITH GFR
AG Ratio: 1.6 (calc) (ref 1.0–2.5)
ALT: 9 U/L (ref 6–29)
AST: 16 U/L (ref 10–35)
Albumin: 4.5 g/dL (ref 3.6–5.1)
Alkaline phosphatase (APISO): 47 U/L (ref 31–125)
BUN: 15 mg/dL (ref 7–25)
CO2: 22 mmol/L (ref 20–32)
Calcium: 9.6 mg/dL (ref 8.6–10.2)
Chloride: 105 mmol/L (ref 98–110)
Creat: 1 mg/dL (ref 0.50–1.10)
GFR, Est African American: 78 mL/min/{1.73_m2} (ref >=60)
GFR, Est Non African American: 68 mL/min/{1.73_m2} (ref >=60)
Globulin: 2.8 g/dL (calc) (ref 1.9–3.7)
Glucose, Bld: 73 mg/dL (ref 65–99)
Potassium: 3.7 mmol/L (ref 3.5–5.3)
Sodium: 139 mmol/L (ref 135–146)
Total Bilirubin: 0.7 mg/dL (ref 0.2–1.2)
Total Protein: 7.3 g/dL (ref 6.1–8.1)

## 2019-12-18 LAB — TSH: TSH: 1.5 mIU/L

## 2020-01-04 ENCOUNTER — Ambulatory Visit (INDEPENDENT_AMBULATORY_CARE_PROVIDER_SITE_OTHER): Payer: 59 | Admitting: Sports Medicine

## 2020-01-04 ENCOUNTER — Ambulatory Visit (INDEPENDENT_AMBULATORY_CARE_PROVIDER_SITE_OTHER): Payer: 59

## 2020-01-04 ENCOUNTER — Other Ambulatory Visit: Payer: Self-pay

## 2020-01-04 ENCOUNTER — Encounter: Payer: Self-pay | Admitting: Sports Medicine

## 2020-01-04 DIAGNOSIS — R635 Abnormal weight gain: Secondary | ICD-10-CM | POA: Diagnosis not present

## 2020-01-04 DIAGNOSIS — G8929 Other chronic pain: Secondary | ICD-10-CM

## 2020-01-04 DIAGNOSIS — M25551 Pain in right hip: Secondary | ICD-10-CM | POA: Diagnosis not present

## 2020-01-04 DIAGNOSIS — M25552 Pain in left hip: Secondary | ICD-10-CM

## 2020-01-04 DIAGNOSIS — S73191A Other sprain of right hip, initial encounter: Secondary | ICD-10-CM | POA: Insufficient documentation

## 2020-01-04 MED ORDER — PHENTERMINE HCL 37.5 MG PO TABS: ORAL_TABLET | ORAL | 0 refills | Status: DC

## 2020-01-04 NOTE — Assessment & Plan Note (Signed)
Trinesha has also noted bilateral hip pain, chronically for years anteriorly in the groin, right worse than left. Typically when riding a bike but not when doing other activities, she also feels it with lunges. She also has a slight catching sensation in the groin. On exam she has a positive FADIR sign, overall good strength and no reproduction with resisted flexion of the hips. Unfortunately this is suspicious for bilateral femoroacetabular labral tears. 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 If no improvement in 4 to 6 weeks we will do MRI hip arthrograms.

## 2020-01-04 NOTE — Assessment & Plan Note (Signed)
Belky returns, she is not quite at her goal weight, which is 120 to 125 pounds, we are going to do a single additional month of phentermine.

## 2020-01-04 NOTE — Progress Notes (Addendum)
    Procedures performed today:    None.  Independent interpretation of notes and tests performed by another provider:   I did personally review her x-rays, there is mild spurring in the superior tips of the acetabulae that can be seen in mild osteoarthritis.  Impression and Recommendations:    Abnormal weight gain [name] returns, she is not quite at her goal weight, which is 120 to 125 pounds, we are going to do a single additional month of phentermine.  Chronic hip pain, bilateral Ana [name] has also noted bilateral hip pain, chronically for years anteriorly in the groin, right worse than left. Typically when riding a bike but not when doing other activities, she also feels it with lunges. She also has a slight catching sensation in the groin. On exam she has a positive FADIR sign, overall good strength and no reproduction with resisted flexion of the hips. Unfortunately this is suspicious for bilateral femoroacetabular labral tears. 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 If no improvement in 4 to 6 weeks we will do MRI hip arthrograms.    ___________________________________________ Gwen Her. Dianah Field, M.D., ABFM., CAQSM. Primary Care and [place] Instructor of [place] of [facility] of Medicine

## 2020-02-01 ENCOUNTER — Other Ambulatory Visit: Payer: Self-pay

## 2020-02-01 ENCOUNTER — Ambulatory Visit (INDEPENDENT_AMBULATORY_CARE_PROVIDER_SITE_OTHER): Payer: 59 | Admitting: Sports Medicine

## 2020-02-01 ENCOUNTER — Encounter: Payer: Self-pay | Admitting: Sports Medicine

## 2020-02-01 DIAGNOSIS — M25551 Pain in right hip: Secondary | ICD-10-CM

## 2020-02-01 DIAGNOSIS — M25552 Pain in left hip: Secondary | ICD-10-CM

## 2020-02-01 DIAGNOSIS — G8929 Other chronic pain: Secondary | ICD-10-CM

## 2020-02-01 NOTE — Progress Notes (Signed)
    Procedures performed today:    None.  Independent interpretation of notes and tests performed by another provider:   None.  Brief History, Exam, Impression, and Recommendations:    Chronic hip pain, bilateral This is a pleasant 49 year old female with persistent right worse than left hip pain. Pain is worse with flexion, adduction, internal rotation. She also gets significant mechanical symptoms. Proceed with MR arthrogram of the right hip after failure of conservative treatment, there was a small amount of acetabular spurring on x-rays.    ___________________________________________ Gwen Her. Dianah Field, M.D., ABFM., CAQSM. Primary Care and [place] Instructor of [place] of [facility] of Medicine

## 2020-02-01 NOTE — Assessment & Plan Note (Signed)
This is a pleasant 49 year old female with persistent right worse than left hip pain. Pain is worse with flexion, adduction, internal rotation. She also gets significant mechanical symptoms. Proceed with MR arthrogram of the right hip after failure of conservative treatment, there was a small amount of acetabular spurring on x-rays.

## 2020-02-14 ENCOUNTER — Other Ambulatory Visit: Payer: 59

## 2020-02-14 ENCOUNTER — Ambulatory Visit: Payer: 59 | Admitting: Sports Medicine

## 2020-02-21 ENCOUNTER — Ambulatory Visit (INDEPENDENT_AMBULATORY_CARE_PROVIDER_SITE_OTHER): Payer: 59 | Admitting: Sports Medicine

## 2020-02-21 ENCOUNTER — Ambulatory Visit (INDEPENDENT_AMBULATORY_CARE_PROVIDER_SITE_OTHER): Payer: 59

## 2020-02-21 ENCOUNTER — Other Ambulatory Visit: Payer: Self-pay

## 2020-02-21 DIAGNOSIS — M25551 Pain in right hip: Secondary | ICD-10-CM | POA: Diagnosis not present

## 2020-02-21 DIAGNOSIS — M25552 Pain in left hip: Secondary | ICD-10-CM

## 2020-02-21 DIAGNOSIS — G8929 Other chronic pain: Secondary | ICD-10-CM

## 2020-02-21 MED ORDER — GADOBUTROL 1 MMOL/ML IV SOLN
1.0000 mL | Freq: Once | INTRAVENOUS | Status: AC | PRN
  Administered 2020-02-21: 1 mL via INTRAVENOUS

## 2020-02-21 NOTE — Progress Notes (Signed)
    Procedures performed today:    Procedure: Real-time Ultrasound Guided gadolinium contrast injection of right hip joint Device: Samsung HS60  Verbal informed consent obtained.  Time-out conducted.  Noted no overlying erythema, induration, or other signs of local infection.  Skin prepped in a sterile fashion.  Local anesthesia: Topical Ethyl chloride.  With sterile technique and under real time ultrasound guidance: 22-gauge spinal needle advanced to the femoral head/neck junction, contacted bone, I then injected 1 cc Kenalog 40, 2 cc lidocaine, 2 cc bupivacaine, syringe switched and 0.1 cc gadolinium injected, syringe again switched and 10 cc sterile saline used to distend the joint. Joint visualized and capsule seen distending confirming intra-articular placement of contrast material and medication. Completed without difficulty  Advised to call if fevers/chills, erythema, induration, drainage, or persistent bleeding.  Images permanently stored and available for review in the ultrasound unit.  Impression: Technically successful ultrasound guided gadolinium contrast injection for MR arthrography.  Please see separate MR arthrogram report.   Independent interpretation of notes and tests performed by another provider:   None.  Brief History, Exam, Impression, and Recommendations:    Chronic hip pain, bilateral This is a very pleasant 49 year old female, persistent right worse than left hip pain. Worse with flexion, adduction, internal rotation, she is here today to do her hip arthrogram injection for MRI. She did have a bit of acetabular spurring on x-rays, there is likely an element of hip osteoarthritis.    ___________________________________________ Gwen Her. Dianah Field, M.D., ABFM., CAQSM. Primary Care and [place] Instructor of [place] of [facility] of Medicine

## 2020-02-21 NOTE — Assessment & Plan Note (Signed)
This is a very pleasant 49 year old female, persistent right worse than left hip pain. Worse with flexion, adduction, internal rotation, she is here today to do her hip arthrogram injection for MRI. She did have a bit of acetabular spurring on x-rays, there is likely an element of hip osteoarthritis.

## 2020-03-29 ENCOUNTER — Other Ambulatory Visit: Payer: Self-pay | Admitting: Sports Medicine

## 2020-03-29 DIAGNOSIS — Z299 Encounter for prophylactic measures, unspecified: Secondary | ICD-10-CM

## 2020-03-29 MED ORDER — ZOLPIDEM TARTRATE 10 MG PO TABS: ORAL_TABLET | ORAL | 3 refills | Status: DC

## 2020-04-05 ENCOUNTER — Telehealth: Payer: Self-pay

## 2020-04-05 DIAGNOSIS — S73191S Other sprain of right hip, sequela: Secondary | ICD-10-CM

## 2020-04-05 NOTE — Telephone Encounter (Signed)
LVM advising patient referral has been sent to [facility] hip center.  Patient should receive scheduling call.

## 2020-04-05 NOTE — Telephone Encounter (Signed)
Absolutely, the plan was to give it a month to see if the injection medication itself helped the pain, if not then hip surgery would be needed, hip arthroscopy with repair may be likely in this scenario, I am going to do the referral to the [facility] hip center.

## 2020-04-05 NOTE — Telephone Encounter (Signed)
Pt called requesting referral to Surgery for hip surgery.  Per her understanding the next step was surgery after imaging.

## 2020-04-28 ENCOUNTER — Ambulatory Visit (INDEPENDENT_AMBULATORY_CARE_PROVIDER_SITE_OTHER): Payer: 59 | Admitting: Sports Medicine

## 2020-04-28 ENCOUNTER — Other Ambulatory Visit: Payer: Self-pay

## 2020-04-28 ENCOUNTER — Ambulatory Visit (INDEPENDENT_AMBULATORY_CARE_PROVIDER_SITE_OTHER): Payer: 59

## 2020-04-28 DIAGNOSIS — M25562 Pain in left knee: Secondary | ICD-10-CM

## 2020-04-28 DIAGNOSIS — M1712 Unilateral primary osteoarthritis, left knee: Secondary | ICD-10-CM | POA: Insufficient documentation

## 2020-04-28 NOTE — Progress Notes (Signed)
    Procedures performed today:    Procedure: Real-time Ultrasound Guided injection of the left knee Device: Samsung HS60  Verbal informed consent obtained.  Time-out conducted.  Noted no overlying erythema, induration, or other signs of local infection.  Skin prepped in a sterile fashion.  Local anesthesia: Topical Ethyl chloride.  With sterile technique and under real time ultrasound guidance: 1 cc Kenalog 40, 2 cc lidocaine, 2 cc bupivacaine injected easily Completed without difficulty  Pain immediately resolved suggesting accurate placement of the medication.  Advised to call if fevers/chills, erythema, induration, drainage, or persistent bleeding.  Images permanently stored and available for review in the ultrasound unit.  Impression: Technically successful ultrasound guided injection.  Independent interpretation of notes and tests performed by another provider:   None.  Brief History, Exam, Impression, and Recommendations:    Left knee pain This is a pleasant 49 year old female, she has a history of a partial meniscectomy at age 24 after a skiing injury. Unfortunately she has started to develop medial joint line pain with gelling, occasional mechanical symptoms with popping and catching. Today she has a very mild joint effusion, she also has pain with terminal flexion, and tenderness at the medial joint line to palpation. Mild patellofemoral crepitus. She has tried some meloxicam without sufficient improvement so today we injected her knee, getting some x-rays, meniscal rehab exercises, return to see me in 1 month, MRI for arthroscopy planning if no better.    ___________________________________________ Gwen Her. Dianah Field, M.D., ABFM., CAQSM. Primary Care and [place] Instructor of [place] of [facility] of Medicine

## 2020-04-28 NOTE — Assessment & Plan Note (Signed)
This is a pleasant 49 year old female, she has a history of a partial meniscectomy at age 54 after a skiing injury. Unfortunately she has started to develop medial joint line pain with gelling, occasional mechanical symptoms with popping and catching. Today she has a very mild joint effusion, she also has pain with terminal flexion, and tenderness at the medial joint line to palpation. Mild patellofemoral crepitus. She has tried some meloxicam without sufficient improvement so today we injected her knee, getting some x-rays, meniscal rehab exercises, return to see me in 1 month, MRI for arthroscopy planning if no better.

## 2020-05-23 ENCOUNTER — Telehealth: Payer: Self-pay | Admitting: Neurology

## 2020-05-23 MED ORDER — DEXAMETHASONE 2 MG PO TABS: ORAL_TABLET | ORAL | 0 refills | Status: DC

## 2020-05-23 NOTE — Telephone Encounter (Signed)
Pt has called Sandy,RN back.  Pt states amerge is not working.  Please

## 2020-05-23 NOTE — Telephone Encounter (Signed)
LMVM for pt that called.  She is taking gabaepntin, topamax, amerge as needed.  Asking for prednisone.

## 2020-05-23 NOTE — Addendum Note (Signed)
Addended by: Suzzanne Cloud on: 05/23/2020 09:07 PM   Modules accepted: Orders

## 2020-05-23 NOTE — Telephone Encounter (Signed)
Chart review.  Last seen in December 2020.  Taking Topamax and gabapentin.  Previously been treated with 3-day Decadron taper.  Has excellent benefit with naratriptan, insurance only allows 4 tablets a month.  If headaches have consistently increase, we may consider CGRP, previously her insurance would not cover Aimovig.  We can send in Decadron 2mg , 3-day taper for acute headache. I went ahead and sent the rx.

## 2020-05-23 NOTE — Telephone Encounter (Signed)
Pt has called to report that her rescue meds not helping on migraine pt has had since Saturday.  Pt is asking if Prednisone can be called in for [id number]

## 2020-05-24 NOTE — Telephone Encounter (Signed)
I called pt and she had not gotten this and was not aware.  I relayed per SS/ NP note that decadron 3 day taper was called into [facility] at 907pm yesterday.  She will pick up. I relayed that another crgp another option then aimovig with insurance did not cover.  She will let us know if things do not improve.

## 2020-05-30 ENCOUNTER — Ambulatory Visit: Payer: 59 | Admitting: Sports Medicine

## 2020-06-12 ENCOUNTER — Ambulatory Visit: Payer: 59 | Admitting: Sports Medicine

## 2020-06-14 ENCOUNTER — Other Ambulatory Visit: Payer: Self-pay

## 2020-06-14 ENCOUNTER — Encounter: Payer: Self-pay | Admitting: Neurology

## 2020-06-14 ENCOUNTER — Telehealth: Payer: Self-pay | Admitting: Neurology

## 2020-06-14 ENCOUNTER — Ambulatory Visit (INDEPENDENT_AMBULATORY_CARE_PROVIDER_SITE_OTHER): Payer: 59 | Admitting: Neurology

## 2020-06-14 VITALS — BP 106/65 | HR 74 | Ht 63.5 in | Wt 122.8 lb

## 2020-06-14 DIAGNOSIS — G43019 Migraine without aura, intractable, without status migrainosus: Secondary | ICD-10-CM | POA: Diagnosis not present

## 2020-06-14 MED ORDER — TOPIRAMATE 50 MG PO TABS: 100.0000 mg | ORAL_TABLET | Freq: Every day | ORAL | 3 refills | Status: DC

## 2020-06-14 MED ORDER — NARATRIPTAN HCL 2.5 MG PO TABS: 2.5000 mg | ORAL_TABLET | Freq: Two times a day (BID) | ORAL | 6 refills | Status: DC | PRN

## 2020-06-14 MED ORDER — GABAPENTIN 300 MG PO CAPS: ORAL_CAPSULE | ORAL | 3 refills | Status: DC

## 2020-06-14 MED ORDER — EMGALITY 120 MG/ML ~~LOC~~ SOAJ: 240.0000 mg | SUBCUTANEOUS | 0 refills | Status: DC

## 2020-06-14 MED ORDER — EMGALITY 120 MG/ML ~~LOC~~ SOAJ: 120.0000 mg | SUBCUTANEOUS | 11 refills | Status: DC

## 2020-06-14 NOTE — Progress Notes (Signed)
PATIENT: Ana [name] DOB: [birth year]  REASON FOR VISIT: follow up HISTORY FROM: patient  HISTORY OF PRESENT ILLNESS: Today 06/14/20  [name] is a 49 year old female history of intractable migraine headache.  She is on Topamax and gabapentin.  A few weeks ago she was given a 3-day Decadron taper for prolonged headache.  She takes naratriptan for abortive therapy with excellent benefit, but only gets 4 tablets a month.  Prescribe diclofenac potassium, did not try the medication.  Cannot tolerate higher gabapentin or Topamax due to side effect.  Even at current dose of Topamax, feels some cognitive side effect.  Headaches continue, could have 1-2 significant migraines monthly, however they may last up to a week.  In August, she had a headache almost daily, possibly triggered by episode of food poisoning and dehydration.  Previously prescribed Aimovig, but was too expensive.  She is gainfully employed, has injured her left knee.  Presents today for evaluation unaccompanied.  HISTORY 10/11/2019 SS: Ms. Ana [name] is a 49 year old female with history of intractable migraine headache.  Her headaches come in clusters, may last 3 to 5 days.  She remains on Topamax and gabapentin.  She has history of TMJ dysfunction. In June, she was given a 3-day Decadron taper for prolonged headache.  She takes naratriptan with excellent benefit, but her insurance company only allows 4 tablets a month.  She was prescribed diclofenac potassium at last visit, but has not tried the medication.  She was unable to tolerate the increase in gabapentin, due to side effect.  She is taking gabapentin 600 mg at bedtime, Topamax 100 mg at bedtime.  She says her headaches have remained stable.  There are some months she does very well, where she just has a minor headache, will take Motrin.  She has noticed that weather changes are trigger for the headaches.  She is able to discern when a significant headache is coming on, for that she  will take naratriptan.  For all her headaches, she is able to push through, continue her daily activities.  Over time, she has learned to manage her headaches.  She says her TMJ dysfunction is under good control.  She denies any changes to her overall health.  She presents today for evaluation via virtual visit.   REVIEW OF SYSTEMS: Out of a complete 14 system review of symptoms, the patient complains only of the following symptoms, and all other reviewed systems are negative.  Headache  ALLERGIES: Allergies  Allergen Reactions  . Omnicef [Cefdinir] Other (See Comments)    GI upset  . Trazodone And Nefazodone Nausea And Vomiting    HOME MEDICATIONS: Outpatient Medications Prior to Visit  Medication Sig Dispense Refill  . fluticasone (FLONASE) 50 MCG/ACT nasal spray Place into the nose.    . zolpidem (AMBIEN) 10 MG tablet TAKE 1 TABLET BY MOUTH EVERY DAY AT BEDTIME AS NEEDED 30 tablet 3  . gabapentin (NEURONTIN) 300 MG capsule Take 2 in the evening 180 capsule 3  . naratriptan (AMERGE) 2.5 MG tablet Take 1 tablet (2.5 mg total) by mouth 2 (two) times daily as needed for migraine. 9 tablet 6  . topiramate (TOPAMAX) 100 MG tablet Take 1 tablet (100 mg total) by mouth at bedtime. 90 tablet 3  . dexamethasone (DECADRON) 2 MG tablet Take 3 tablets on the first day, then take 2 on the second, then take 1 on the third 6 tablet 0  . phentermine (ADIPEX-P) 37.5 MG tablet One tab by mouth  qAM 30 tablet 0   No facility-administered medications prior to visit.    PAST MEDICAL HISTORY: Past Medical History:  Diagnosis Date  . Asthma    intermittent  . Borderline hyperlipidemia    No med trials in the past  . Common migraine with intractable migraine 11/14/2015  . Fibroids   . GERD (gastroesophageal reflux disease)    controlled with diet  . Headache 08/23/2015  . Migraine headache    about 2 per yr  . Seasonal allergies     PAST SURGICAL HISTORY: Past Surgical History:  Procedure  Laterality Date  . ABDOMINAL HYSTERECTOMY  08/31/2012   Procedure: HYSTERECTOMY ABDOMINAL;  Surgeon: Luz Lex, MD;  Location: [facility];  Service: Gynecology;  Laterality: N/A;  . CESAREAN SECTION  2005, 2007   x2  . DILATION AND CURETTAGE OF UTERUS     missed ab  . KNEE ARTHROSCOPY    . MYOMECTOMY    . TONSILLECTOMY    . WISDOM TOOTH EXTRACTION      FAMILY HISTORY: Family History  Problem Relation Age of Onset  . Arthritis Mother   . Hypertension Mother   . Hyperlipidemia Father   . Hypertension Father   . Cancer Paternal Grandmother        breast  . Arthritis Maternal Grandmother   . Heart disease Paternal Grandfather   . Migraines Neg Hx     SOCIAL HISTORY: Social History   Socioeconomic History  . Marital status: Married    Spouse name: Matt  . Number of children: 2  . Years of education: College  . Highest education level: Not on file  Occupational History    Employer: [employer]  Tobacco Use  . Smoking status: Never Smoker  . Smokeless tobacco: Never Used  Substance and Sexual Activity  . Alcohol use: No    Alcohol/week: 0.0 standard drinks    Comment: social 1 glass of wine monthly  . Drug use: No  . Sexual activity: Not on file  Other Topics Concern  . Not on file  Social History Narrative   Married.  Two kids.   College grad--[employer].   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   No T/A/Ds.   Caffeine Use: 1-2 cup daily   Patient is right handed.       Social Determinants of Health   Financial Resource Strain:   . Difficulty of Paying Living Expenses: Not on file  Food Insecurity:   . Worried About Charity fundraiser in the Last Year: Not on file  . Ran Out of Food in the Last Year: Not on file  Transportation Needs:   . Lack of Transportation (Medical): Not on file  . Lack of Transportation (Non-Medical): Not on file  Physical Activity:   . Days of Exercise per Week: Not on file  . Minutes of Exercise per Session: Not on file   Stress:   . Feeling of Stress : Not on file  Social Connections:   . Frequency of Communication with Friends and Family: Not on file  . Frequency of Social Gatherings with Friends and Family: Not on file  . Attends Religious Services: Not on file  . Active Member of Clubs or Organizations: Not on file  . Attends Archivist Meetings: Not on file  . Marital Status: Not on file  Intimate Partner Violence:   . Fear of Current or Ex-Partner: Not on file  . Emotionally Abused: Not on file  .  Physically Abused: Not on file  . Sexually Abused: Not on file   PHYSICAL EXAM  Vitals:   06/14/20 0740  BP: 106/65  Pulse: 74  Weight: 122 lb 12.8 oz (55.7 kg)  Height: 5' 3.5" (1.613 m)   Body mass index is 21.41 kg/m.  Generalized: Well developed, in no acute distress   Neurological examination  Mentation: Alert oriented to time, place, history taking. Follows all commands speech and language fluent Cranial nerve II-XII: Pupils were equal round reactive to light. Extraocular movements were full, visual field were full on confrontational test. Facial sensation and strength were normal.  Head turning and shoulder shrug  were normal and symmetric. Motor: The motor testing reveals 5 over 5 strength of all extremities, but limited with left lower due to knee injury.  Good symmetric motor tone is noted throughout.  Sensory: Sensory testing is intact to soft touch on all 4 extremities. No evidence of extinction is noted.  Coordination: Cerebellar testing reveals good finger-nose-finger and heel-to-shin bilaterally.  Gait and station: Gait is normal, but limp on the left Reflexes: Deep tendon reflexes are symmetric and normal bilaterally.   DIAGNOSTIC DATA (LABS, IMAGING, TESTING) - I reviewed patient records, labs, notes, testing and imaging myself where available.  Lab Results  Component Value Date   WBC 6.0 12/17/2019   HGB 14.6 12/17/2019   HCT 42.2 12/17/2019   MCV 85.9  12/17/2019   PLT 241 12/17/2019      Component Value Date/Time   NA 139 12/17/2019 0814   K 3.7 12/17/2019 0814   CL 105 12/17/2019 0814   CO2 22 12/17/2019 0814   GLUCOSE 73 12/17/2019 0814   BUN 15 12/17/2019 0814   CREATININE 1.00 12/17/2019 0814   CALCIUM 9.6 12/17/2019 0814   PROT 7.3 12/17/2019 0814   ALBUMIN 4.8 01/23/2015 1014   AST 16 12/17/2019 0814   ALT 9 12/17/2019 0814   ALKPHOS 51 01/23/2015 1014   BILITOT 0.7 12/17/2019 0814   GFRNONAA 68 12/17/2019 0814   GFRAA 78 12/17/2019 0814   Lab Results  Component Value Date   CHOL 213 (H) 12/17/2019   HDL 67 12/17/2019   LDLCALC 129 (H) 12/17/2019   TRIG 74 12/17/2019   CHOLHDL 3.2 12/17/2019   Lab Results  Component Value Date   HGBA1C 5.1 05/05/2014   No results found for: VITAMINB12 Lab Results  Component Value Date   TSH 1.50 12/17/2019   ASSESSMENT AND PLAN 49 y.o. year old female  has a past medical history of Asthma, Borderline hyperlipidemia, Common migraine with intractable migraine (11/14/2015), Fibroids, GERD (gastroesophageal reflux disease), Headache (08/23/2015), Migraine headache, and Seasonal allergies. here with:  1.  Chronic migraine headache  -Continues with frequent migraines, on average, 1-2 significant migraines monthly, but may last up to 7 days each one, in August, had daily headache  -Try Emgality 240 mg monthly injection loading dose, followed by maintenance dose 120 mg injection for migraine prevention, will try to get insurance to cover, we talked about using co-pay card as option  -For now, continue Topamax 100 mg at bedtime, switch to 50 mg tablets, if she wants to taper  -Continue gabapentin 600 mg at bedtime  -If she does well with Emgality, we will try to taper Topamax and/or gabapentin  -Previously tried and failed nortriptyline, Topamax, gabapentin  -Continue naratriptan for abortive therapy  -Follow-up in 6 months or sooner if needed  I spent 30 minutes of  face-to-face and non-face-to-face time with patient.  This included previsit chart review, lab review, study review, order entry, electronic health record documentation, patient education.  Butler Denmark, AGNP-C, DNP 06/14/2020, 8:14 AM [facility] [address] [place], [place] [address] [phone]

## 2020-06-14 NOTE — Progress Notes (Signed)
I have read the note, and I agree with the clinical assessment and plan.  Keanon Bevins K Presten Joost   

## 2020-06-14 NOTE — Patient Instructions (Signed)
Let's try the [facility] for migraine prevention  Initial loading dose is 240 mg monthly injection  Followed 30 days later by 120 mg every 30 days  Continue other medications for now  See you back in 6 months   Galcanezumab injection What is this medicine? GALCANEZUMAB (gal ka NEZ ue mab) is used to prevent migraines and treat cluster headaches. This medicine may be used for other purposes; ask your health care provider or pharmacist if you have questions. COMMON BRAND NAME(S): Emgality What should I tell my health care provider before I take this medicine? They need to know if you have any of these conditions:  an unusual or allergic reaction to galcanezumab, other medicines, foods, dyes, or preservatives  pregnant or trying to get pregnant  breast-feeding How should I use this medicine? This medicine is for injection under the skin. You will be taught how to prepare and give this medicine. Use exactly as directed. Take your medicine at regular intervals. Do not take your medicine more often than directed. It is important that you put your used needles and syringes in a special sharps container. Do not put them in a trash can. If you do not have a sharps container, call your pharmacist or healthcare provider to get one. Talk to your pediatrician regarding the use of this medicine in children. Special care may be needed. Overdosage: If you think you have taken too much of this medicine contact a poison control center or emergency room at once. NOTE: This medicine is only for you. Do not share this medicine with others. What if I miss a dose? If you miss a dose, take it as soon as you can. If it is almost time for your next dose, take only that dose. Do not take double or extra doses. What may interact with this medicine? Interactions are not expected. This list may not describe all possible interactions. Give your health care provider a list of all the medicines, herbs, non-prescription  drugs, or dietary supplements you use. Also tell them if you smoke, drink alcohol, or use illegal drugs. Some items may interact with your medicine. What should I watch for while using this medicine? Tell your doctor or healthcare professional if your symptoms do not start to get better or if they get worse. What side effects may I notice from receiving this medicine? Side effects that you should report to your doctor or health care professional as soon as possible:  allergic reactions like skin rash, itching or hives, swelling of the face, lips, or tongue Side effects that usually do not require medical attention (report these to your doctor or health care professional if they continue or are bothersome):  pain, redness, or irritation at site where injected This list may not describe all possible side effects. Call your doctor for medical advice about side effects. You may report side effects to FDA at 1-800-FDA-1088. Where should I keep my medicine? Keep out of the reach of children. You will be instructed on how to store this medicine. Throw away any unused medicine after the expiration date on the label. NOTE: This sheet is a summary. It may not cover all possible information. If you have questions about this medicine, talk to your doctor, pharmacist, or health care provider.  2020 Elsevier/Gold Standard (2018-03-18 12:03:23)

## 2020-06-14 NOTE — Telephone Encounter (Signed)
Received PA request from patient's pharmacy. PA was started on [email or web address]. Key is [id number]. Per CMM, a determination will be made within 72 hours. Will check back for a decision later.

## 2020-07-30 ENCOUNTER — Other Ambulatory Visit: Payer: Self-pay | Admitting: Sports Medicine

## 2020-07-30 DIAGNOSIS — Z299 Encounter for prophylactic measures, unspecified: Secondary | ICD-10-CM

## 2020-10-08 ENCOUNTER — Other Ambulatory Visit: Payer: Self-pay | Admitting: Neurology

## 2020-10-28 ENCOUNTER — Other Ambulatory Visit: Payer: Self-pay | Admitting: Neurology

## 2020-11-06 DIAGNOSIS — G43909 Migraine, unspecified, not intractable, without status migrainosus: Secondary | ICD-10-CM | POA: Insufficient documentation

## 2020-11-06 DIAGNOSIS — J45909 Unspecified asthma, uncomplicated: Secondary | ICD-10-CM | POA: Insufficient documentation

## 2020-11-24 ENCOUNTER — Other Ambulatory Visit: Payer: Self-pay | Admitting: Sports Medicine

## 2020-11-24 DIAGNOSIS — Z299 Encounter for prophylactic measures, unspecified: Secondary | ICD-10-CM

## 2020-12-11 NOTE — Progress Notes (Signed)
PATIENT: Ana [name] DOB: 01-Oct-1971  REASON FOR VISIT: follow up HISTORY FROM: patient  HISTORY OF PRESENT ILLNESS: Today 12/12/20 Ana [name] is a 50 year old female with history of intractable migraine headache.  She is on Topamax and gabapentin.  Takes naratriptan for abortive therapy.  Cannot tolerate higher doses of gabapentin or Topamax due to side effect. Had a lot of migraines during winter, weather related. Now, very rare day with no headache, the severity ranges. Usually has 1 severe a week, that could last a few days. Has to keep working, works full-time. With headaches, get dizziness, loopy feeling. Even on Topamax 100 mg at bedtime, has side effects of word finding trouble. Previously prescribed Aimovig and Emgality, reports insurance covered only small portion.  She did not start the medications.  Here today for evaluation unaccompanied.  HISTORY 06/14/2020 SS: Ana [name] is a 50 year old female history of intractable migraine headache.  She is on Topamax and gabapentin.  A few weeks ago she was given a 3-day Decadron taper for prolonged headache.  She takes naratriptan for abortive therapy with excellent benefit, but only gets 4 tablets a month.  Prescribe diclofenac potassium, did not try the medication.  Cannot tolerate higher gabapentin or Topamax due to side effect.  Even at current dose of Topamax, feels some cognitive side effect.  Headaches continue, could have 1-2 significant migraines monthly, however they may last up to a week.  In August, she had a headache almost daily, possibly triggered by episode of food poisoning and dehydration.  Previously prescribed Aimovig, but was too expensive.  She is gainfully employed, has injured her left knee.  Presents today for evaluation unaccompanied.   REVIEW OF SYSTEMS: Out of a complete 14 system review of symptoms, the patient complains only of the following symptoms, and all other reviewed systems are  negative.  Headache  ALLERGIES: Allergies  Allergen Reactions  . Omnicef [Cefdinir] Other (See Comments)    GI upset  . Trazodone And Nefazodone Nausea And Vomiting    HOME MEDICATIONS: Outpatient Medications Prior to Visit  Medication Sig Dispense Refill  . fluticasone (FLONASE) 50 MCG/ACT nasal spray Place into the nose.    . zolpidem (AMBIEN) 10 MG tablet TAKE 1 TABLET BY MOUTH EVERY DAY AT BEDTIME AS NEEDED 30 tablet 3  . gabapentin (NEURONTIN) 300 MG capsule Take 2 in the evening 180 capsule 3  . naratriptan (AMERGE) 2.5 MG tablet Take 1 tablet (2.5 mg total) by mouth 2 (two) times daily as needed for migraine. 9 tablet 6  . topiramate (TOPAMAX) 50 MG tablet Take 2 tablets (100 mg total) by mouth at bedtime. 180 tablet 3  . Galcanezumab-gnlm (EMGALITY) 120 MG/ML SOAJ Inject 240 mg into the skin every 30 (thirty) days. 2 mL 0  . Galcanezumab-gnlm (EMGALITY) 120 MG/ML SOAJ Inject 120 mg into the skin every 30 (thirty) days. 1 mL 11   No facility-administered medications prior to visit.    PAST MEDICAL HISTORY: Past Medical History:  Diagnosis Date  . Asthma    intermittent  . Borderline hyperlipidemia    No med trials in the past  . Common migraine with intractable migraine 11/14/2015  . Fibroids   . GERD (gastroesophageal reflux disease)    controlled with diet  . Headache 08/23/2015  . Migraine headache    about 2 per yr  . Seasonal allergies     PAST SURGICAL HISTORY: Past Surgical History:  Procedure Laterality Date  . ABDOMINAL HYSTERECTOMY  08/31/2012  Procedure: HYSTERECTOMY ABDOMINAL;  Surgeon: Luz Lex, MD;  Location: [facility];  Service: Gynecology;  Laterality: N/A;  . CESAREAN SECTION  2005, 2007   x2  . DILATION AND CURETTAGE OF UTERUS     missed ab  . KNEE ARTHROSCOPY    . MYOMECTOMY    . TONSILLECTOMY    . WISDOM TOOTH EXTRACTION      FAMILY HISTORY: Family History  Problem Relation Age of Onset  . Arthritis Mother   . Hypertension  Mother   . Hyperlipidemia Father   . Hypertension Father   . Cancer Paternal Grandmother        breast  . Arthritis Maternal Grandmother   . Heart disease Paternal Grandfather   . Migraines Neg Hx     SOCIAL HISTORY: Social History   Socioeconomic History  . Marital status: Married    Spouse name: Matt  . Number of children: 2  . Years of education: College  . Highest education level: Not on file  Occupational History    Employer: [employer]  Tobacco Use  . Smoking status: Never Smoker  . Smokeless tobacco: Never Used  Substance and Sexual Activity  . Alcohol use: No    Alcohol/week: 0.0 standard drinks    Comment: social 1 glass of wine monthly  . Drug use: No  . Sexual activity: Not on file  Other Topics Concern  . Not on file  Social History Narrative   Married.  Two kids.   College grad--[employer].   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   No T/A/Ds.   Caffeine Use: 1-2 cup daily   Patient is right handed.       Social Determinants of Health   Financial Resource Strain: Not on file  Food Insecurity: Not on file  Transportation Needs: Not on file  Physical Activity: Not on file  Stress: Not on file  Social Connections: Not on file  Intimate Partner Violence: Not on file      PHYSICAL EXAM  Vitals:   12/12/20 0730  BP: 103/67  Pulse: 76  Weight: 123 lb (55.8 kg)  Height: 5\' 4"  (1.626 m)   Body mass index is 21.11 kg/m.  Generalized: Well developed, in no acute distress   Neurological examination  Mentation: Alert oriented to time, place, history taking. Follows all commands speech and language fluent Cranial nerve II-XII: Pupils were equal round reactive to light. Extraocular movements were full, visual field were full on confrontational test. Facial sensation and strength were normal. Head turning and shoulder shrug  were normal and symmetric. Motor: The motor testing reveals 5 over 5 strength of all 4 extremities. Good symmetric  motor tone is noted throughout.  Sensory: Sensory testing is intact to soft touch on all 4 extremities. No evidence of extinction is noted.  Coordination: Cerebellar testing reveals good finger-nose-finger and heel-to-shin bilaterally.  Gait and station: Gait is normal.  Reflexes: Deep tendon reflexes are symmetric and normal bilaterally.   DIAGNOSTIC DATA (LABS, IMAGING, TESTING) - I reviewed patient records, labs, notes, testing and imaging myself where available.  Lab Results  Component Value Date   WBC 6.0 12/17/2019   HGB 14.6 12/17/2019   HCT 42.2 12/17/2019   MCV 85.9 12/17/2019   PLT 241 12/17/2019      Component Value Date/Time   NA 139 12/17/2019 0814   K 3.7 12/17/2019 0814   CL 105 12/17/2019 0814   CO2 22 12/17/2019 0814   GLUCOSE 73 12/17/2019  0814   BUN 15 12/17/2019 0814   CREATININE 1.00 12/17/2019 0814   CALCIUM 9.6 12/17/2019 0814   PROT 7.3 12/17/2019 0814   ALBUMIN 4.8 01/23/2015 1014   AST 16 12/17/2019 0814   ALT 9 12/17/2019 0814   ALKPHOS 51 01/23/2015 1014   BILITOT 0.7 12/17/2019 0814   GFRNONAA 68 12/17/2019 0814   GFRAA 78 12/17/2019 0814   Lab Results  Component Value Date   CHOL 213 (H) 12/17/2019   HDL 67 12/17/2019   LDLCALC 129 (H) 12/17/2019   TRIG 74 12/17/2019   CHOLHDL 3.2 12/17/2019   Lab Results  Component Value Date   HGBA1C 5.1 05/05/2014   No results found for: VITAMINB12 Lab Results  Component Value Date   TSH 1.50 12/17/2019   ASSESSMENT AND PLAN 50 y.o. year old female  has a past medical history of Asthma, Borderline hyperlipidemia, Common migraine with intractable migraine (11/14/2015), Fibroids, GERD (gastroesophageal reflux disease), Headache (08/23/2015), Migraine headache, and Seasonal allergies. here with:  1.  Chronic migraine headache  -Continues with frequent migraines, very rare day with no headache, having a few severe migraines a week, can last several days -Will try Ajovy, using savings co-pay card  she can access online -Remain on Topamax and gabapentin for now, cannot tolerate higher doses, if she does well on the Ajovy, we will first try to back off the gabapentin; has previously tried nortriptyline -Continue naratriptan for acute headache, uses Tylenol for the milder headaches -Encouraged to reach out with my chart update if she runs into problems using savings card -Follow-up in 6 to 8 months or sooner if needed  I spent 20 minutes of face-to-face and non-face-to-face time with patient.  This included previsit chart review, lab review, study review, order entry, electronic health record documentation, patient education.  Butler Denmark, AGNP-C, DNP 12/12/2020, 8:04 AM [facility] [address] [place], [place] [address] [phone]

## 2020-12-12 ENCOUNTER — Encounter: Payer: Self-pay | Admitting: Neurology

## 2020-12-12 ENCOUNTER — Other Ambulatory Visit: Payer: Self-pay

## 2020-12-12 ENCOUNTER — Other Ambulatory Visit: Payer: Self-pay | Admitting: Neurology

## 2020-12-12 ENCOUNTER — Ambulatory Visit (INDEPENDENT_AMBULATORY_CARE_PROVIDER_SITE_OTHER): Payer: 59 | Admitting: Neurology

## 2020-12-12 VITALS — BP 103/67 | HR 76 | Ht 64.0 in | Wt 123.0 lb

## 2020-12-12 DIAGNOSIS — G43019 Migraine without aura, intractable, without status migrainosus: Secondary | ICD-10-CM | POA: Diagnosis not present

## 2020-12-12 MED ORDER — GABAPENTIN 300 MG PO CAPS: ORAL_CAPSULE | ORAL | 3 refills | Status: DC

## 2020-12-12 MED ORDER — TOPIRAMATE 50 MG PO TABS: 100.0000 mg | ORAL_TABLET | Freq: Every day | ORAL | 3 refills | Status: DC

## 2020-12-12 MED ORDER — AJOVY 225 MG/1.5ML ~~LOC~~ SOAJ: 225.0000 mg | SUBCUTANEOUS | 11 refills | Status: DC

## 2020-12-12 MED ORDER — NARATRIPTAN HCL 2.5 MG PO TABS: 2.5000 mg | ORAL_TABLET | Freq: Two times a day (BID) | ORAL | 6 refills | Status: DC | PRN

## 2020-12-12 NOTE — Telephone Encounter (Signed)
Inititated CMM KEY # [id number] for ajovy.  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

## 2020-12-12 NOTE — Progress Notes (Signed)
I have read the note, and I agree with the clinical assessment and plan.  Charles K Willis   

## 2020-12-12 NOTE — Patient Instructions (Signed)
Add on Ajovy for migraine prevention  Continue other medications Use the savings card for the Ajovy See you back in 6-8 months

## 2020-12-19 ENCOUNTER — Telehealth: Payer: Self-pay | Admitting: *Deleted

## 2020-12-19 NOTE — Telephone Encounter (Signed)
I reviewed savings card with patient. She may already gotten Ajovy with card. We need to check. If not, I'll switch to [employer].

## 2020-12-19 NOTE — Telephone Encounter (Signed)
Initiated [place] for ajovy.  Received that AJOVY not covered benefit and excluded from the coverage from plan benefit.  [employer].

## 2020-12-21 ENCOUNTER — Other Ambulatory Visit: Payer: Self-pay | Admitting: Neurology

## 2020-12-21 MED ORDER — EMGALITY 120 MG/ML ~~LOC~~ SOAJ: SUBCUTANEOUS | 0 refills | Status: DC

## 2020-12-21 NOTE — Addendum Note (Signed)
Addended by: Brandon Melnick on: 12/21/2020 05:24 PM   Modules accepted: Orders

## 2020-12-26 ENCOUNTER — Telehealth: Payer: Self-pay | Admitting: *Deleted

## 2020-12-26 NOTE — Telephone Encounter (Signed)
Initiated [place].  For Emgality loading dose 240mg , then 120mg  every 30 days.

## 2020-12-26 NOTE — Telephone Encounter (Signed)
Received electronic message that Ana [name] is not covered. May need prior authorization.

## 2021-01-02 NOTE — Telephone Encounter (Signed)
I called pt and relayed that emgality denied.  She does not recall that she had tried any of the other medications  I did mention that SS/NP was ok to try BOTOX if pt ok, and she did say she had 15 migraines per month.  I made VV for her to discuss.  (she could not come in, works in [employer]) but was ok to do VV) she will have to come in to sign consent. (or may email).

## 2021-01-02 NOTE — Telephone Encounter (Signed)
The request for coverage for Emgality Inj 120mg /Ml, use as directed (63ml per month), is denied. This decision is based on health plan criteria for Emgality. This medicine is covered only if: You have failed (after a trial of at least two months) or cannot use one of the following preventive therapies (document name and date tried): (1) Amitriptyline (Elavil). (2) One of the following beta-blockers: Atenolol, metoprolol, nadolol, propranolol, or timolol. (3) Divalproex sodium (Depakote/Depakote ER). (4) Venlafaxine (Effexor/Effexor XR). (5) OnabotulinumtoxinA (Botox). The information provided does not show that you meet the criteria listed above. *Please note: This medication cannot be further reviewed for the quantity limit until the above criteria have been addressed.

## 2021-01-02 NOTE — Telephone Encounter (Signed)
Please check with her about the tried and failed. If her insurance prefers Botox over CGRP, I am fine to try that, she should be the 15 days a month criteria with migraines.

## 2021-01-02 NOTE — Progress Notes (Signed)
.Virtual Visit via Video Note  I connected with Ana [name] on 01/02/21 at 11:15 AM EDT by a video enabled telemedicine application and verified that I am speaking with the correct person using two identifiers.  Location: Patient: at her work Provider: in the office    I discussed the limitations of evaluation and management by telemedicine and the availability of in person appointments. The patient expressed understanding and agreed to proceed.  History of Present Illness: 01/03/2021 SS: Ana [name] is a 50 year old female with history of intractable migraine headache.  Remains on Topamax and gabapentin, cannot tolerate higher doses.  Having frequent headaches, near daily, 1 severe week, last a few days.  Certainly has at least 15 headache days a month.  We have tried to get CGRP covered from her insurance, but with all 3, the cost remains high.  Has previously been unable to tolerate amitriptyline, due to severe vomiting.  Would not offer propanolol due to asthma.  Is very sensitive to medication side effects.  Would prefer CGRP, but would be open to Botox if insurance prefers.  Update 12/12/2020 SS: Ana [name] is a 50 year old female with history of intractable migraine headache.  She is on Topamax and gabapentin.  Takes naratriptan for abortive therapy.  Cannot tolerate higher doses of gabapentin or Topamax due to side effect. Had a lot of migraines during winter, weather related. Now, very rare day with no headache, the severity ranges. Usually has 1 severe a week, that could last a few days. Has to keep working, works full-time. With headaches, get dizziness, loopy feeling. Even on Topamax 100 mg at bedtime, has side effects of word finding trouble. Previously prescribed Aimovig and Emgality, reports insurance covered only small portion.  She did not start the medications.  Here today for evaluation unaccompanied.   Observations/Objective: Via virtual visit, is alert and oriented,  speech is clear and concise, facial symmetry noted, excellent historian  Assessment and Plan: 1.  Chronic migraine headaches -At least 15 migraines a month  -Trying to get CGRP coverage, insurance pushing back, this is their most recent response for Emgality:  The request for coverage for Emgality Inj 120mg /Ml, use as directed (70ml per month), is denied. This decision is based on health plan criteria for Emgality. This medicine is covered only if: You have failed (after a trial of at least two months) or cannot use one of the following preventive therapies (document name and date tried): (1) Amitriptyline (Elavil)-Previously tried, had severe vomiting  (2) One of the following beta-blockers: Atenolol, metoprolol, nadolol, propranolol, or timolol. Has asthma, wouldn't offer BB (3) Divalproex sodium (Depakote/Depakote ER). (4) Venlafaxine (Effexor/Effexor XR). Hesitant to try antidepressant, give significant side effect from amitriptyline   (5) OnabotulinumtoxinA (Botox). Willing to try if CGRP isn't approved   -We will continue to try and get CGRP covered, if not consider Botox, her current treatment with Topamax and Gabapentin isn't providing adequate control, cannot tolerate higher doses   Follow Up Instructions: Next is October 2022   I discussed the assessment and treatment plan with the patient. The patient was provided an opportunity to ask questions and all were answered. The patient agreed with the plan and demonstrated an understanding of the instructions.   The patient was advised to call back or seek an in-person evaluation if the symptoms worsen or if the condition fails to improve as anticipated.  I spent 20 minutes of face-to-face and non-face-to-face time with patient.  This included previsit chart  review, lab review, study review, order entry, electronic health record documentation, patient education.  Evangeline Dakin, DNP  [facility] [address] [place], [place] [address] [phone]

## 2021-01-03 ENCOUNTER — Encounter: Payer: Self-pay | Admitting: Neurology

## 2021-01-03 ENCOUNTER — Telehealth (INDEPENDENT_AMBULATORY_CARE_PROVIDER_SITE_OTHER): Payer: 59 | Admitting: Neurology

## 2021-01-03 DIAGNOSIS — G43019 Migraine without aura, intractable, without status migrainosus: Secondary | ICD-10-CM

## 2021-01-03 NOTE — Telephone Encounter (Signed)
Sandy, copying my recent office visit with response from insurance, let's try to get insurance coverage for CGRP, which ever her insurance prefers if okay.   The request for coverage for Emgality Inj 120mg /Ml, use as directed (24ml per month), is denied. This decision is based on health plan criteria for Emgality. This medicine is covered only if: You have failed (after a trial of at least two months) or cannot use one of the following preventive therapies (document name and date tried): (1) Amitriptyline (Elavil)-Previously tried, had severe vomiting  (2) One of the following beta-blockers: Atenolol, metoprolol, nadolol, propranolol, or timolol. Has asthma, wouldn't offer BB (3) Divalproex sodium (Depakote/Depakote ER). (4) Venlafaxine (Effexor/Effexor XR). Hesitant to try antidepressant, give significant side effect from amitriptyline   (5) OnabotulinumtoxinA (Botox). Willing to try if CGRP isn't approved

## 2021-01-03 NOTE — Telephone Encounter (Signed)
I called [facility]. Spoke to Philo.  We did PA over the phone. Faxed last ofv note to [phone].  Relayed newest information to then relating to tried and failed medications.  Amitriptyline severe vomiting, BB not tried due to Asthma, other antidepressants not tried due to amitriptyline -vomiting. REF # [id number].  4 days pending determination (clinical review).

## 2021-01-07 NOTE — Progress Notes (Signed)
I have read the note, and I agree with the clinical assessment and plan.  Brihana Quickel K Zurri Rudden   

## 2021-01-10 ENCOUNTER — Other Ambulatory Visit: Payer: Self-pay

## 2021-01-10 ENCOUNTER — Ambulatory Visit (INDEPENDENT_AMBULATORY_CARE_PROVIDER_SITE_OTHER): Payer: 59

## 2021-01-10 ENCOUNTER — Encounter: Payer: Self-pay | Admitting: *Deleted

## 2021-01-10 ENCOUNTER — Ambulatory Visit (INDEPENDENT_AMBULATORY_CARE_PROVIDER_SITE_OTHER): Payer: 59 | Admitting: Physician Assistant

## 2021-01-10 ENCOUNTER — Encounter: Payer: Self-pay | Admitting: Physician Assistant

## 2021-01-10 ENCOUNTER — Other Ambulatory Visit: Payer: Self-pay | Admitting: Physician Assistant

## 2021-01-10 VITALS — BP 152/95 | HR 100 | Ht 64.0 in | Wt 123.0 lb

## 2021-01-10 DIAGNOSIS — R1013 Epigastric pain: Secondary | ICD-10-CM

## 2021-01-10 DIAGNOSIS — R079 Chest pain, unspecified: Secondary | ICD-10-CM

## 2021-01-10 LAB — COMPLETE METABOLIC PANEL WITH GFR
AG Ratio: 1.9 (calc) (ref 1.0–2.5)
ALT: 14 U/L (ref 6–29)
AST: 13 U/L (ref 10–35)
Albumin: 4.9 g/dL (ref 3.6–5.1)
Alkaline phosphatase (APISO): 51 U/L (ref 31–125)
BUN: 20 mg/dL (ref 7–25)
CO2: 26 mmol/L (ref 20–32)
Calcium: 9.8 mg/dL (ref 8.6–10.2)
Chloride: 103 mmol/L (ref 98–110)
Creat: 0.92 mg/dL (ref 0.50–1.10)
GFR, Est African American: 86 mL/min/{1.73_m2} (ref >=60)
GFR, Est Non African American: 74 mL/min/{1.73_m2} (ref >=60)
Globulin: 2.6 g/dL (calc) (ref 1.9–3.7)
Glucose, Bld: 87 mg/dL (ref 65–139)
Potassium: 3.6 mmol/L (ref 3.5–5.3)
Sodium: 139 mmol/L (ref 135–146)
Total Bilirubin: 0.7 mg/dL (ref 0.2–1.2)
Total Protein: 7.5 g/dL (ref 6.1–8.1)

## 2021-01-10 LAB — CBC WITH DIFFERENTIAL/PLATELET
Absolute Monocytes: 540 cells/uL (ref 200–950)
Basophils Absolute: 52 cells/uL (ref 0–200)
Basophils Relative: 0.7 %
Eosinophils Absolute: 104 cells/uL (ref 15–500)
Eosinophils Relative: 1.4 %
HCT: 42.7 % (ref 35.0–45.0)
Hemoglobin: 14.3 g/dL (ref 11.7–15.5)
Lymphs Abs: 1909 cells/uL (ref 850–3900)
MCH: 28.8 pg (ref 27.0–33.0)
MCHC: 33.5 g/dL (ref 32.0–36.0)
MCV: 85.9 fL (ref 80.0–100.0)
MPV: 10.1 fL (ref 7.5–12.5)
Monocytes Relative: 7.3 %
Neutro Abs: 4795 cells/uL (ref 1500–7800)
Neutrophils Relative %: 64.8 %
Platelets: 235 10*3/uL (ref 140–400)
RBC: 4.97 10*6/uL (ref 3.80–5.10)
RDW: 12.3 % (ref 11.0–15.0)
Total Lymphocyte: 25.8 %
WBC: 7.4 10*3/uL (ref 3.8–10.8)

## 2021-01-10 LAB — CK TOTAL AND CKMB (NOT AT ARMC)
CK, MB: <0.7 ng/mL (ref 0–5.0)
Total CK: 31 U/L (ref 29–143)

## 2021-01-10 LAB — D-DIMER, QUANTITATIVE: D-Dimer, Quant: 0.31 mcg/mL FEU (ref <0.50)

## 2021-01-10 LAB — LIPASE: Lipase: 28 U/L (ref 7–60)

## 2021-01-10 LAB — TROPONIN I: Troponin I: <3 ng/L (ref <=47)

## 2021-01-10 MED ORDER — LIDOCAINE VISCOUS HCL 2 % MT SOLN
15.0000 mL | Freq: Once | OROMUCOSAL | Status: AC
  Administered 2021-01-10: 15 mL via OROMUCOSAL

## 2021-01-10 MED ORDER — OMEPRAZOLE 40 MG PO CPDR: 40.0000 mg | DELAYED_RELEASE_CAPSULE | Freq: Two times a day (BID) | ORAL | 1 refills | Status: DC

## 2021-01-10 MED ORDER — HYOSCYAMINE SULFATE 0.125 MG PO TBDP
0.1250 mg | ORAL_TABLET | Freq: Once | ORAL | Status: AC
  Administered 2021-01-10: 0.125 mg via SUBLINGUAL

## 2021-01-10 MED ORDER — IOHEXOL 300 MG/ML  SOLN
100.0000 mL | Freq: Once | INTRAMUSCULAR | Status: AC | PRN
  Administered 2021-01-10: 100 mL via INTRAVENOUS

## 2021-01-10 MED ORDER — ALUM & MAG HYDROXIDE-SIMETH 200-200-20 MG/5ML PO SUSP
30.0000 mL | Freq: Once | ORAL | Status: AC
Start: 2021-01-10 — End: 2021-01-10
  Administered 2021-01-10: 30 mL via ORAL

## 2021-01-10 NOTE — Progress Notes (Signed)
Ana [name],  D-dimer normal.  Normal WBC. Kidney, liver, glucose normal.  Normal pancreatic enzymes.   Cardiac enzymes still pending.

## 2021-01-10 NOTE — Progress Notes (Signed)
Will you take a look as well? This is pt this am. Trace left pleural effusion in same lung as ground glass nodule?  CBC/CMP/d-dimer/lipase all normal. Cardiac enzymes pending.

## 2021-01-10 NOTE — Progress Notes (Signed)
Ana [name],   Cardiac enzymes are normal.

## 2021-01-10 NOTE — Progress Notes (Signed)
Subjective:    Patient ID: Ana [name], female    DOB: [birth year], 50 y.o.   MRN: [id number]  HPI  Patient is a 50 year old female with history of elevated LDL and acid reflux who presents to the clinic with sudden epigastric pain radiating to left chest pain.  Few weeks ago she felt like her reflux symptoms could be worsening a bit.  She started taking Pepcid more regularly.  Yesterday she woke up with sudden epigastric pain that she describes as sharp and radiating into her left chest.  She thought it was just more reflux issues and took Pepcid and Tums during the day.  At times it got a little better and then would get worse.  She denies any nausea, vomiting, constipation, diarrhea, hematochezia, melena.  It is not made worse by food or drinking.  It is being worse by sitting, walking, sitting up and made better by laying flat.  No significant family history of cardiovascular disease only her paternal grandfather had an MI in his 36s. Her last LDL was 139 not on a statin.   .. Active Ambulatory Problems    Diagnosis Date Noted  . Insomnia 12/06/2012  . 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 Ganglion cyst of left foot 04/28/2014  . Hyperlipidemia 04/28/2014  . Preventive measure 04/28/2014  . Lyme disease 01/23/2015  . Diastasis recti 03/20/2015  . Sinusitis, chronic 07/24/2015  . Common migraine with intractable migraine 11/14/2015  . Epigastric pain 12/02/2017  . DDD (degenerative disc disease), cervical 04/13/2018  . TMJ (dislocation of temporomandibular joint) 07/13/2018  . Abnormal weight gain 12/07/2019  . Labral tear of right hip joint 01/04/2020  . Left knee pain 04/28/2020  . Chest pain 01/10/2021   Resolved Ambulatory Problems    Diagnosis Date Noted  . Sinusitis, acute 12/06/2012  . Right shoulder pain 04/28/2014  . Stye 05/31/2014  . Overweight 12/23/2014  . Right shoulder pain 05/22/2015  . History of migraine headaches 07/19/2015  . Headache 08/23/2015  .  Irritation of right eye 09/30/2016  . Gastroenteritis and colitis, viral 12/27/2016  . Cellulitis of external ear, left 12/27/2016   Past Medical History:  Diagnosis Date  . Asthma   . Borderline hyperlipidemia   . Fibroids   . GERD (gastroesophageal reflux disease)   . Migraine headache   . Seasonal allergies          Review of Systems See HPI.     Objective:   Physical Exam Vitals reviewed.  Constitutional:      Appearance: She is well-developed.     Comments: Pt had a grimaced face and worried look during encounter.   Cardiovascular:     Rate and Rhythm: Normal rate and regular rhythm.     Heart sounds: Normal heart sounds.  Pulmonary:     Effort: Pulmonary effort is normal.     Breath sounds: Normal breath sounds. No wheezing or rhonchi.  Chest:     Chest wall: No tenderness.  Abdominal:     General: Bowel sounds are normal. There is no distension or abdominal bruit.     Palpations: Abdomen is soft. There is no mass.     Tenderness: There is abdominal tenderness in the epigastric area. There is no right CVA tenderness, left CVA tenderness, guarding or rebound. Negative signs include Murphy's sign, Rovsing's sign, McBurney's sign, psoas sign and obturator sign.     Hernia: No hernia is present.  Neurological:     General: No focal deficit  present.     Mental Status: She is alert.  Psychiatric:        Mood and Affect: Mood is anxious.           Assessment & Plan:  Marland KitchenMarland KitchenShaniqwa was seen today for abdominal pain and chest pain.  Diagnoses and all orders for this visit:  Epigastric pain -     CT Abdomen Pelvis W Contrast -     CBC with Differential/Platelet -     COMPLETE METABOLIC PANEL WITH GFR -     Lipase -     Troponin I -     CK Total (and CKMB) -     D-Dimer, Quantitative -     alum & mag hydroxide-simeth (MAALOX/MYLANTA) 200-200-20 MG/5ML suspension 30 mL -     hyoscyamine (ANASPAZ) disintergrating tablet 0.125 mg -     lidocaine (XYLOCAINE) 2 %  viscous mouth solution 15 mL  Chest pain, unspecified type -     EKG 12-Lead -     CT Abdomen Pelvis W Contrast -     CBC with Differential/Platelet -     COMPLETE METABOLIC PANEL WITH GFR -     Lipase -     Troponin I -     CK Total (and CKMB) -     D-Dimer, Quantitative -     alum & mag hydroxide-simeth (MAALOX/MYLANTA) 200-200-20 MG/5ML suspension 30 mL -     hyoscyamine (ANASPAZ) disintergrating tablet 0.125 mg -     lidocaine (XYLOCAINE) 2 % viscous mouth solution 15 mL   Unclear etiology but patient is in some pain today and grimaced throughout the entire visit.  She has no other features of gastritis and pepcid not helping.  GI cocktail given in office with minimal benefit.  EKG-NSR at 80. No arrhthymias. No ST elevation or Depression.  CT abdomen ordered STAT to rule out acute aortic dissection/aneursym.  STAT cardiac enzymes/cbc/cmp/lipase/d-dimer ordered to rule out MI, gastritis, pancreatitis, pulmonary embolism.  BP elevated. Pulse Ox 99 percent.   Reviewed history and treatment plan with Dr. Aundria Mems and agreed with plan.   Spent 40 minutes with patient, reviewing chart, consulting primary care provider, discussing treatment plan.

## 2021-01-10 NOTE — Telephone Encounter (Signed)
[facility] emgality 120 mg/mL approved until 07/07/2021.

## 2021-01-10 NOTE — Progress Notes (Signed)
Ana [name],   Tried calling and went to VM.   No acute findings that need urgent attention.   Thyroid nodule but did not appear to be clinically significant.   Trace fluid in the bottom part of left lung. Unclear why this is happening and what is causing. Your do have a left upper lobe nodule that been present since 2009 but does appear to be more solid.   Left ovarian cyst.   Constipation with stool seen in colon.   I would suspect this to be more epigastric/gastritis. I am going to send over omeprazole to take 40mg  twice a day. Avoid any caffiene, NSAIDs, spicy foods. Give me update on how you are feeling tomorrow.

## 2021-01-11 ENCOUNTER — Other Ambulatory Visit: Payer: Self-pay | Admitting: Physician Assistant

## 2021-01-11 DIAGNOSIS — R911 Solitary pulmonary nodule: Secondary | ICD-10-CM

## 2021-01-11 DIAGNOSIS — J90 Pleural effusion, not elsewhere classified: Secondary | ICD-10-CM

## 2021-01-11 NOTE — Progress Notes (Signed)
Ana [name],   How are you feeling today?

## 2021-01-11 NOTE — Progress Notes (Signed)
Consulted with Dr. Darene Lamer your PCP due to the non urgent but evidence of pleural effusion and old lung nodule. Will refer to pulmonology.

## 2021-01-12 ENCOUNTER — Ambulatory Visit (INDEPENDENT_AMBULATORY_CARE_PROVIDER_SITE_OTHER): Payer: 59 | Admitting: Sports Medicine

## 2021-01-12 ENCOUNTER — Encounter: Payer: Self-pay | Admitting: Sports Medicine

## 2021-01-12 ENCOUNTER — Telehealth: Payer: Self-pay | Admitting: Sports Medicine

## 2021-01-12 ENCOUNTER — Other Ambulatory Visit: Payer: Self-pay

## 2021-01-12 DIAGNOSIS — M1712 Unilateral primary osteoarthritis, left knee: Secondary | ICD-10-CM

## 2021-01-12 DIAGNOSIS — R1013 Epigastric pain: Secondary | ICD-10-CM

## 2021-01-12 DIAGNOSIS — R911 Solitary pulmonary nodule: Secondary | ICD-10-CM | POA: Diagnosis not present

## 2021-01-12 DIAGNOSIS — G43019 Migraine without aura, intractable, without status migrainosus: Secondary | ICD-10-CM

## 2021-01-12 MED ORDER — TROKENDI XR 100 MG PO CP24
1.0000 | ORAL_CAPSULE | Freq: Every day | ORAL | 11 refills | Status: DC
Start: 2021-01-12 — End: 2021-05-22

## 2021-01-12 MED ORDER — NURTEC 75 MG PO TBDP: 1.0000 | ORAL_TABLET | ORAL | 11 refills | Status: DC

## 2021-01-12 NOTE — Telephone Encounter (Signed)
Please work on Orthovisc for the left knee, failed steroid injections, x-ray confirmed, failed surgery.

## 2021-01-12 NOTE — Assessment & Plan Note (Signed)
Postop now, it sounds like she had meniscectomy as well as cartilage debridement, persistent pain. She does have x-rays confirming osteoarthritis, at this point we are going to get her approved for Orthovisc.

## 2021-01-12 NOTE — Progress Notes (Signed)
    Procedures performed today:    None.  Independent interpretation of notes and tests performed by another provider:   None.  Brief History, Exam, Impression, and Recommendations:    Common migraine with intractable migraine Tephanie continues to have migraines, she and her neurologist have been working hard to find a good preventative agent but it sounds like Ajovy, Emgality, and Aimovig were either not covered or excessively expensive even with coverage. She has daily migraine headaches. Has failed multiple oral agents including rizatriptan, sumatriptan. We are going to try Nurtec ODT which can be used for prevention as well. I will give her a discount coupon. In addition she has significant cognitive side effects from Topamax, has not yet tried Trokendi. Switching with a discount coupon as well.  Primary osteoarthritis of left knee Postop now, it sounds like she had meniscectomy as well as cartilage debridement, persistent pain. She does have x-rays confirming osteoarthritis, at this point we are going to get her approved for Orthovisc.  Epigastric pain It sounds like Avalon had a few episodes of severe epigastric discomfort, better now with omeprazole, this was likely peptic ulcer disease versus gastritis, with a component of esophagitis. No melena, hematochezia, hematemesis. We will watch this for now, and excellent work-up thus far was negative, including a CT abdomen and pelvis. If recurrent of symptoms we will have her get an EGD.  Pulmonary nodule Left lobe cavitated nodule noted on chest CT, there was some left pleural effusion, we definitely want pulmonology to weigh in here.    ___________________________________________ Gwen Her. Dianah Field, M.D., ABFM., CAQSM. Primary Care and [place] Instructor of [place] [employer] [facility]

## 2021-01-12 NOTE — Progress Notes (Signed)
Stay on omeprazole for at least 6 weeks. Sudden gastritis can also be caused by a bacteria in the stomach called h. Pylori that is often hard to detect when on acid reducers. If pain not completely resolving need to consider referral to GI for endoscopy and biopsy of stomach.

## 2021-01-12 NOTE — Assessment & Plan Note (Signed)
It sounds like Ana [name] had a few episodes of severe epigastric discomfort, better now with omeprazole, this was likely peptic ulcer disease versus gastritis, with a component of esophagitis. No melena, hematochezia, hematemesis. We will watch this for now, and excellent work-up thus far was negative, including a CT abdomen and pelvis. If recurrent of symptoms we will have her get an EGD.

## 2021-01-12 NOTE — Assessment & Plan Note (Signed)
Ana [name] continues to have migraines, she and her neurologist have been working hard to find a good preventative agent but it sounds like Ajovy, Emgality, and Aimovig were either not covered or excessively expensive even with coverage. She has daily migraine headaches. Has failed multiple oral agents including rizatriptan, sumatriptan. We are going to try Nurtec ODT which can be used for prevention as well. I will give her a discount coupon. In addition she has significant cognitive side effects from Topamax, has not yet tried Trokendi. Switching with a discount coupon as well.

## 2021-01-12 NOTE — Assessment & Plan Note (Signed)
Left lobe cavitated nodule noted on chest CT, there was some left pleural effusion, we definitely want pulmonology to weigh in here.

## 2021-01-15 ENCOUNTER — Telehealth: Payer: Self-pay

## 2021-01-15 NOTE — Telephone Encounter (Signed)
Patient called back this morning to state that she definitely wants to go ahead and see if her insurance will cover the orthovisc injections.

## 2021-01-17 NOTE — Telephone Encounter (Signed)
Done

## 2021-01-17 NOTE — Telephone Encounter (Signed)
Started Orthovisc process waiting on BID and to see if PA is required. - CF 

## 2021-01-18 ENCOUNTER — Encounter: Payer: Self-pay | Admitting: Emergency Medicine

## 2021-01-18 ENCOUNTER — Ambulatory Visit (INDEPENDENT_AMBULATORY_CARE_PROVIDER_SITE_OTHER): Payer: 59 | Admitting: Emergency Medicine

## 2021-01-18 ENCOUNTER — Other Ambulatory Visit: Payer: Self-pay

## 2021-01-18 VITALS — BP 116/70 | HR 79 | Temp 98.0°F | Ht 64.0 in | Wt 125.6 lb

## 2021-01-18 DIAGNOSIS — R911 Solitary pulmonary nodule: Secondary | ICD-10-CM | POA: Diagnosis not present

## 2021-01-18 NOTE — Progress Notes (Signed)
Subjective:    Patient ID: Ana [name], female    DOB: 08/31/1971, 50 y.o.   MRN: [id number]  HPI 50 year old never smoker with a history of childhood asthma and possible adult symptoms as well.  Other history includes migraine headaches, GERD, seasonal allergies.  CT scan of the chest abdomen and pelvis performed on 01/10/2021 reviewed by me, shows a 5 mm mixed density left upper lobe pulmonary nodule with a 2 mm solid central component.  This was seen initially on CT 2009 when it was purely groundglass, measured 7 x 6 mm.   Review of Systems As per HPI  Past Medical History:  Diagnosis Date  . Asthma    intermittent  . Borderline hyperlipidemia    No med trials in the past  . Common migraine with intractable migraine 11/14/2015  . Fibroids   . GERD (gastroesophageal reflux disease)    controlled with diet  . Headache 08/23/2015  . Migraine headache    about 2 per yr  . Seasonal allergies      Family History  Problem Relation Age of Onset  . Arthritis Mother   . Hypertension Mother   . Hyperlipidemia Father   . Hypertension Father   . Cancer Paternal Grandmother        breast  . Arthritis Maternal Grandmother   . Heart disease Paternal Grandfather   . Migraines Neg Hx      Social History   Socioeconomic History  . Marital status: Married    Spouse name: Matt  . Number of children: 2  . Years of education: College  . Highest education level: Not on file  Occupational History    Employer: [employer]  Tobacco Use  . Smoking status: Never Smoker  . Smokeless tobacco: Never Used  Substance and Sexual Activity  . Alcohol use: No    Alcohol/week: 0.0 standard drinks    Comment: social 1 glass of wine monthly  . Drug use: No  . Sexual activity: Not on file  Other Topics Concern  . Not on file  Social History Narrative   Married.  Two kids.   College grad--[employer].   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   No T/A/Ds.   Caffeine Use: 1-2 cup  daily   Patient is right handed.       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   Allergies  Allergen Reactions  . Omnicef [Cefdinir] Other (See Comments)    GI upset  . Trazodone And Nefazodone Nausea And Vomiting     Outpatient Medications Prior to Visit  Medication Sig Dispense Refill  . naratriptan (AMERGE) 2.5 MG tablet Take 1 tablet (2.5 mg total) by mouth 2 (two) times daily as needed for migraine. 9 tablet 6  . omeprazole (PRILOSEC) 40 MG capsule Take 1 capsule (40 mg total) by mouth in the morning and at bedtime. 60 capsule 1  . Topiramate ER (TROKENDI XR) 100 MG CP24 Take 1 capsule by mouth daily. 30 capsule 11  . zolpidem (AMBIEN) 10 MG tablet TAKE  1 TABLET BY MOUTH EVERY DAY AT BEDTIME AS NEEDED 30 tablet 3  . Rimegepant Sulfate (NURTEC) 75 MG TBDP Take 1 tablet by mouth every other day. (Patient not taking: Reported on 01/18/2021) 8 tablet 11  . famotidine (PEPCID) 20 MG tablet Take 20 mg by mouth 2 (two) times daily as needed for heartburn or indigestion.     No facility-administered medications prior to visit.        Objective:   Physical Exam Vitals:   01/18/21 1532  BP: 116/70  Pulse: 79  Temp: 98 F (36.7 C)  TempSrc: Temporal  SpO2: 100%  Weight: 125 lb 9.6 oz (57 kg)  Height: 5\' 4"  (1.626 m)   Gen: Pleasant, well-nourished, in no distress,  normal affect  ENT: No lesions,  mouth clear,  oropharynx clear, no postnasal drip  Neck: No JVD, no stridor  Lungs: No use of accessory muscles, no crackles or wheezing on normal respiration, no wheeze on forced  expiration  Cardiovascular: RRR, heart sounds normal, no murmur or gallops, no peripheral edema  Musculoskeletal: No deformities, no cyanosis or clubbing  Neuro: alert, awake, non focal  Skin: Warm, no lesions or rash      Assessment & Plan:  Pulmonary nodule Small groundglass left upper lobe pulmonary nodule.  Interestingly it was seen 13 years ago at which time it was slightly larger but did not have any central solid component.  Now it is a few millimeters smaller but there may be some central solidification.  Significance of this is difficult to say given the time interval, but 13 years of little change is overall very reassuring.  I debated as to whether this needs to be followed with serial films (it may not).  I think would be reasonable to do at least 1 repeat scan in March 2023 to ensure that the solid central component is not evolving.  If at that time the nodule is reassuring then we can probably defer any other scans.  Baltazar Apo, MD, PhD 01/18/2021, 4:07 PM [facility] [phone] or if no answer before 7:00PM call [phone] For any issues after 7:00PM please call eLink [phone]

## 2021-01-18 NOTE — Patient Instructions (Signed)
There is a small low risk pulmonary nodule evident in the left upper lobe. We will plan to repeat your CT scan of the chest in March 2023 to compare to your priors. Follow Dr. Lamonte Sakai in March after your CT scan so that we can review the results together.

## 2021-01-18 NOTE — Assessment & Plan Note (Signed)
Small groundglass left upper lobe pulmonary nodule.  Interestingly it was seen 13 years ago at which time it was slightly larger but did not have any central solid component.  Now it is a few millimeters smaller but there may be some central solidification.  Significance of this is difficult to say given the time interval, but 13 years of little change is overall very reassuring.  I debated as to whether this needs to be followed with serial films (it may not).  I think would be reasonable to do at least 1 repeat scan in March 2023 to ensure that the solid central component is not evolving.  If at that time the nodule is reassuring then we can probably defer any other scans.

## 2021-01-28 ENCOUNTER — Other Ambulatory Visit: Payer: Self-pay | Admitting: Sports Medicine

## 2021-01-28 DIAGNOSIS — G43019 Migraine without aura, intractable, without status migrainosus: Secondary | ICD-10-CM

## 2021-02-01 ENCOUNTER — Other Ambulatory Visit: Payer: Self-pay | Admitting: Physician Assistant

## 2021-02-01 ENCOUNTER — Telehealth: Payer: Self-pay | Admitting: Neurology

## 2021-02-01 NOTE — Telephone Encounter (Signed)
Prior Authorization for Nurtec submitted via covermymeds. Awaiting response.  [facility] is processing your PA request and will respond shortly with next steps. You may close this dialog, return to your dashboard, and perform other tasks. To check for an update later, open this request again from your dashboard.  If you need assistance, please chat with CoverMyMeds or call us at [phone].

## 2021-03-11 ENCOUNTER — Other Ambulatory Visit: Payer: Self-pay | Admitting: Neurology

## 2021-03-13 ENCOUNTER — Telehealth: Payer: Self-pay | Admitting: Sports Medicine

## 2021-03-13 DIAGNOSIS — M1712 Unilateral primary osteoarthritis, left knee: Secondary | ICD-10-CM

## 2021-03-13 MED ORDER — EUFLEXXA 20 MG/2ML IX SOSY: PREFILLED_SYRINGE | INTRA_ARTICULAR | 1 refills | Status: DC

## 2021-03-13 NOTE — Telephone Encounter (Signed)
Patient must try Euflexxa, Durolane, and Gelsyn 3 first before Orthovisc. Dr. Dianah Field will send Euflexxa to [facility] and we will get patient scheduled for injections when medication arrives. - CF

## 2021-03-13 NOTE — Telephone Encounter (Signed)
Done

## 2021-03-13 NOTE — Telephone Encounter (Signed)
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

## 2021-03-16 ENCOUNTER — Encounter: Payer: Self-pay | Admitting: Neurology

## 2021-03-19 MED ORDER — TOPIRAMATE 50 MG PO TABS: 100.0000 mg | ORAL_TABLET | Freq: Every day | ORAL | 2 refills | Status: DC

## 2021-03-28 ENCOUNTER — Other Ambulatory Visit: Payer: Self-pay | Admitting: Sports Medicine

## 2021-03-28 DIAGNOSIS — Z299 Encounter for prophylactic measures, unspecified: Secondary | ICD-10-CM

## 2021-05-02 NOTE — Telephone Encounter (Signed)
Euflexxa received in office. Please call patient to schedule. Once weekly for 3 weeks.

## 2021-05-03 NOTE — Telephone Encounter (Signed)
Left voicemail message for patient to call back to get these weekly appointments scheduled for 3 weeks. AM

## 2021-05-15 ENCOUNTER — Ambulatory Visit: Payer: 59 | Admitting: Sports Medicine

## 2021-05-22 ENCOUNTER — Ambulatory Visit (INDEPENDENT_AMBULATORY_CARE_PROVIDER_SITE_OTHER): Payer: 59

## 2021-05-22 ENCOUNTER — Ambulatory Visit (INDEPENDENT_AMBULATORY_CARE_PROVIDER_SITE_OTHER): Payer: 59 | Admitting: Sports Medicine

## 2021-05-22 ENCOUNTER — Other Ambulatory Visit: Payer: Self-pay

## 2021-05-22 DIAGNOSIS — M1712 Unilateral primary osteoarthritis, left knee: Secondary | ICD-10-CM

## 2021-05-22 NOTE — Progress Notes (Signed)
    Procedures performed today:    Procedure: Real-time Ultrasound Guided injection of the left knee Device: Samsung HS60  Verbal informed consent obtained.  Time-out conducted.  Noted no overlying erythema, induration, or other signs of local infection.  Skin prepped in a sterile fashion.  Local anesthesia: Topical Ethyl chloride.  With sterile technique and under real time ultrasound guidance: I advanced a 22-gauge needle into the suprapatellar recess, 1 syringe of Euflexxa injected easily.  Noted only trace effusion. Completed without difficulty  Advised to call if fevers/chills, erythema, induration, drainage, or persistent bleeding.  Images permanently stored and available for review in PACS.  Impression: Technically successful ultrasound guided injection.  Independent interpretation of notes and tests performed by another provider:   None.  Brief History, Exam, Impression, and Recommendations:    Primary osteoarthritis of left knee After a valiant effort we were finally able to get Euflexxa approved, she has osteoarthritis, she is postop from meniscectomy and cartilage debridement. Today we did her first Euflexxa injection, return in 1 week for #2 in 1 week after that for #3.    ___________________________________________ Gwen Her. Dianah Field, M.D., ABFM., CAQSM. Primary Care and [place] Instructor of [place] of [facility] of Medicine

## 2021-05-22 NOTE — Assessment & Plan Note (Signed)
After a valiant effort we were finally able to get Euflexxa approved, she has osteoarthritis, she is postop from meniscectomy and cartilage debridement. Today we did her first Euflexxa injection, return in 1 week for #2 in 1 week after that for #3.

## 2021-05-29 ENCOUNTER — Ambulatory Visit: Payer: 59 | Admitting: Sports Medicine

## 2021-05-30 ENCOUNTER — Ambulatory Visit: Payer: 59 | Admitting: Sports Medicine

## 2021-06-05 ENCOUNTER — Other Ambulatory Visit: Payer: Self-pay

## 2021-06-05 ENCOUNTER — Ambulatory Visit (INDEPENDENT_AMBULATORY_CARE_PROVIDER_SITE_OTHER): Payer: 59 | Admitting: Sports Medicine

## 2021-06-05 ENCOUNTER — Ambulatory Visit (INDEPENDENT_AMBULATORY_CARE_PROVIDER_SITE_OTHER): Payer: 59

## 2021-06-05 DIAGNOSIS — M1712 Unilateral primary osteoarthritis, left knee: Secondary | ICD-10-CM | POA: Diagnosis not present

## 2021-06-05 NOTE — Assessment & Plan Note (Signed)
Euflexxa injection #2 of 3 left knee, return in 1 week for #3 of 3. Of note she is postop from meniscectomy and cartilage debridement.

## 2021-06-05 NOTE — Progress Notes (Signed)
    Procedures performed today:    Procedure: Real-time Ultrasound Guided injection of the left knee Device: Samsung HS60  Verbal informed consent obtained.  Time-out conducted.  Noted no overlying erythema, induration, or other signs of local infection.  Skin prepped in a sterile fashion.  Local anesthesia: Topical Ethyl chloride.  With sterile technique and under real time ultrasound guidance: I advanced a 22-gauge needle into the suprapatellar recess, 1 syringe of Euflexxa injected easily.  Noted only trace effusion. Completed without difficulty  Advised to call if fevers/chills, erythema, induration, drainage, or persistent bleeding.  Images permanently stored and available for review in PACS.  Impression: Technically successful ultrasound guided injection.  Independent interpretation of notes and tests performed by another provider:   None.  Brief History, Exam, Impression, and Recommendations:    Primary osteoarthritis of left knee Euflexxa injection #2 of 3 left knee, return in 1 week for #3 of 3. Of note she is postop from meniscectomy and cartilage debridement.    ___________________________________________ Gwen Her. Dianah Field, M.D., ABFM., CAQSM. Primary Care and [place] Instructor of [place] of [facility] of Medicine

## 2021-06-06 ENCOUNTER — Encounter: Payer: Self-pay | Admitting: Neurology

## 2021-06-07 ENCOUNTER — Other Ambulatory Visit: Payer: Self-pay | Admitting: Neurology

## 2021-06-07 MED ORDER — PREDNISONE 5 MG PO TABS: ORAL_TABLET | ORAL | 0 refills | Status: DC

## 2021-06-12 ENCOUNTER — Other Ambulatory Visit: Payer: Self-pay

## 2021-06-12 ENCOUNTER — Ambulatory Visit (INDEPENDENT_AMBULATORY_CARE_PROVIDER_SITE_OTHER): Payer: 59 | Admitting: Sports Medicine

## 2021-06-12 ENCOUNTER — Ambulatory Visit (INDEPENDENT_AMBULATORY_CARE_PROVIDER_SITE_OTHER): Payer: 59

## 2021-06-12 DIAGNOSIS — M1712 Unilateral primary osteoarthritis, left knee: Secondary | ICD-10-CM

## 2021-06-12 DIAGNOSIS — E7800 Pure hypercholesterolemia, unspecified: Secondary | ICD-10-CM

## 2021-06-12 NOTE — Progress Notes (Signed)
    Procedures performed today:    Procedure: Real-time Ultrasound Guided injection of the left knee Device: Samsung HS60  Verbal informed consent obtained.  Time-out conducted.  Noted no overlying erythema, induration, or other signs of local infection.  Skin prepped in a sterile fashion.  Local anesthesia: Topical Ethyl chloride.  With sterile technique and under real time ultrasound guidance: I advanced a 22-gauge needle into the suprapatellar recess, 1 syringe of Euflexxa injected easily.  Noted only trace effusion. Completed without difficulty  Advised to call if fevers/chills, erythema, induration, drainage, or persistent bleeding.  Images permanently stored and available for review in PACS.  Impression: Technically successful ultrasound guided injection.  Independent interpretation of notes and tests performed by another provider:   None.  Brief History, Exam, Impression, and Recommendations:    Primary osteoarthritis of left knee Euflexxa injection #3 of 3 left knee, of note postop from meniscectomy and cartilage debridement. Return to see me in 1 month as needed.    ___________________________________________ Gwen Her. Dianah Field, M.D., ABFM., CAQSM. Primary Care and [place] Instructor of [place] of [facility] of Medicine

## 2021-06-12 NOTE — Assessment & Plan Note (Signed)
Euflexxa injection #3 of 3 left knee, of note postop from meniscectomy and cartilage debridement. Return to see me in 1 month as needed.

## 2021-07-06 ENCOUNTER — Other Ambulatory Visit: Payer: Self-pay

## 2021-07-06 ENCOUNTER — Ambulatory Visit (INDEPENDENT_AMBULATORY_CARE_PROVIDER_SITE_OTHER): Payer: 59 | Admitting: Sports Medicine

## 2021-07-06 DIAGNOSIS — Z0000 Encounter for general adult medical examination without abnormal findings: Secondary | ICD-10-CM | POA: Diagnosis not present

## 2021-07-06 MED ORDER — IPRATROPIUM BROMIDE 0.06 % NA SOLN: 2.0000 | Freq: Four times a day (QID) | NASAL | 11 refills | Status: DC

## 2021-07-06 NOTE — Progress Notes (Signed)
Subjective:    CC: Annual Physical Exam  HPI:  This patient is here for their annual physical  I reviewed the past medical history, family history, social history, surgical history, and allergies today and no changes were needed.  Please see the problem list section below in epic for further details.  Past Medical History: Past Medical History:  Diagnosis Date   Asthma    intermittent   Borderline hyperlipidemia    No med trials in the past   Common migraine with intractable migraine 11/14/2015   Fibroids    GERD (gastroesophageal reflux disease)    controlled with diet   Headache 08/23/2015   Migraine headache    about 2 per yr   Seasonal allergies    Past Surgical History: Past Surgical History:  Procedure Laterality Date   ABDOMINAL HYSTERECTOMY  08/31/2012   Procedure: HYSTERECTOMY ABDOMINAL;  Surgeon: Luz Lex, MD;  Location: [facility];  Service: Gynecology;  Laterality: N/A;   CESAREAN SECTION  2005, 2007   x2   DILATION AND CURETTAGE OF UTERUS     missed ab   KNEE ARTHROSCOPY     MYOMECTOMY     TONSILLECTOMY     WISDOM TOOTH EXTRACTION     Social History: Social History   Socioeconomic History   Marital status: Married    Spouse name: Ana [name]   Number of children: 2   Years of education: College   Highest education level: Not on file  Occupational History    Employer: [employer]  Tobacco Use   Smoking status: Never   Smokeless tobacco: Never  Substance and Sexual Activity   Alcohol use: No    Alcohol/week: 0.0 standard drinks    Comment: social 1 glass of wine monthly   Drug use: No   Sexual activity: Not on file  Other Topics Concern   Not on file  Social History Narrative   Married.  Two kids.   College grad--[place].   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   No T/A/Ds.   Caffeine Use: 1-2 cup daily   Patient is right handed.       Social Determinants of Health   Financial Resource Strain: Not on file  Food Insecurity: Not  on file  Transportation Needs: Not on file  Physical Activity: Not on file  Stress: Not on file  Social Connections: Not on file   Family History: Family History  Problem Relation Age of Onset   Arthritis Mother    Hypertension Mother    Hyperlipidemia Father    Hypertension Father    Cancer Paternal Grandmother        breast   Arthritis Maternal Grandmother    Heart disease Paternal Grandfather    Migraines Neg Hx    Allergies: Allergies  Allergen Reactions   Omnicef [Cefdinir] Other (See Comments)    GI upset   Trazodone And Nefazodone Nausea And Vomiting   Medications: See med rec.  Review of Systems: No headache, visual changes, nausea, vomiting, diarrhea, constipation, dizziness, abdominal pain, skin rash, fevers, chills, night sweats, swollen lymph nodes, weight loss, chest pain, body aches, joint swelling, muscle aches, shortness of breath, mood changes, visual or auditory hallucinations.  Objective:    General: Well Developed, well nourished, and in no acute distress.  Neuro: Alert and oriented x3, extra-ocular muscles intact, sensation grossly intact. Cranial nerves II through XII are intact, motor, sensory, and coordinative functions are all intact. HEENT: Normocephalic, atraumatic, pupils equal round  reactive to light, neck supple, no masses, no lymphadenopathy, thyroid nonpalpable. Oropharynx, nasopharynx, external ear canals are unremarkable. Skin: Warm and dry, no rashes noted.  Cardiac: Regular rate and rhythm, no murmurs rubs or gallops.  Respiratory: Clear to auscultation bilaterally. Not using accessory muscles, speaking in full sentences.  Abdominal: Soft, nontender, nondistended, positive bowel sounds, no masses, no organomegaly.  Musculoskeletal: Shoulder, elbow, wrist, hip, knee, ankle stable, and with full range of motion.  Impression and Recommendations:    The patient was counselled, risk factors were discussed, anticipatory guidance  given.  Annual physical exam Ana [name] returns, she is a very pleasant 50 year old female here for physical, she is happy, healthy, she is due for colon cancer screening, she will look into Cologuard and colonoscopy and let me know which when she would like to do, she did have her labs drawn today, she is feeling a bit under the weather, has some rhinitis, likely viral so I will add some nasal Atrovent, she can continue over-the-counter analgesics, no constitutional symptoms, lungs are clear. Because of the above however we will hold off on her flu and Tdap and she will make an appointment for a nurse visit for the shots when she feels completely better.   ___________________________________________ Gwen Her. Dianah Field, M.D., ABFM., CAQSM. Primary Care and Sports Medicine  [facility]  Adjunct Professor of [place] of [facility] of Medicine

## 2021-07-06 NOTE — Assessment & Plan Note (Signed)
Ana [name] returns, she is a very pleasant 50 year old female here for physical, she is happy, healthy, she is due for colon cancer screening, she will look into Cologuard and colonoscopy and let me know which when she would like to do, she did have her labs drawn today, she is feeling a bit under the weather, has some rhinitis, likely viral so I will add some nasal Atrovent, she can continue over-the-counter analgesics, no constitutional symptoms, lungs are clear. Because of the above however we will hold off on her flu and Tdap and she will make an appointment for a nurse visit for the shots when she feels completely better.

## 2021-07-07 LAB — COMPREHENSIVE METABOLIC PANEL
AG Ratio: 1.7 (calc) (ref 1.0–2.5)
ALT: 17 U/L (ref 6–29)
AST: 20 U/L (ref 10–35)
Albumin: 4.6 g/dL (ref 3.6–5.1)
Alkaline phosphatase (APISO): 69 U/L (ref 31–125)
BUN: 16 mg/dL (ref 7–25)
CO2: 24 mmol/L (ref 20–32)
Calcium: 9.5 mg/dL (ref 8.6–10.2)
Chloride: 107 mmol/L (ref 98–110)
Creat: 0.9 mg/dL (ref 0.50–0.99)
Globulin: 2.7 g/dL (calc) (ref 1.9–3.7)
Glucose, Bld: 86 mg/dL (ref 65–99)
Potassium: 3.8 mmol/L (ref 3.5–5.3)
Sodium: 141 mmol/L (ref 135–146)
Total Bilirubin: 0.5 mg/dL (ref 0.2–1.2)
Total Protein: 7.3 g/dL (ref 6.1–8.1)

## 2021-07-07 LAB — CBC
HCT: 43 % (ref 35.0–45.0)
Hemoglobin: 14.5 g/dL (ref 11.7–15.5)
MCH: 29 pg (ref 27.0–33.0)
MCHC: 33.7 g/dL (ref 32.0–36.0)
MCV: 86 fL (ref 80.0–100.0)
MPV: 10.2 fL (ref 7.5–12.5)
Platelets: 236 10*3/uL (ref 140–400)
RBC: 5 10*6/uL (ref 3.80–5.10)
RDW: 11.8 % (ref 11.0–15.0)
WBC: 3.5 10*3/uL — ABNORMAL LOW (ref 3.8–10.8)

## 2021-07-07 LAB — LIPID PANEL
Cholesterol: 202 mg/dL — ABNORMAL HIGH (ref <200)
HDL: 63 mg/dL (ref >=50)
LDL Cholesterol (Calc): 122 mg/dL (calc) — ABNORMAL HIGH
Non-HDL Cholesterol (Calc): 139 mg/dL (calc) — ABNORMAL HIGH (ref <130)
Total CHOL/HDL Ratio: 3.2 (calc) (ref <5.0)
Triglycerides: 74 mg/dL (ref <150)

## 2021-07-07 LAB — TSH: TSH: 1.23 mIU/L

## 2021-07-19 ENCOUNTER — Other Ambulatory Visit: Payer: Self-pay | Admitting: Neurology

## 2021-07-24 ENCOUNTER — Encounter: Payer: Self-pay | Admitting: Neurology

## 2021-07-24 ENCOUNTER — Telehealth: Payer: Self-pay | Admitting: Neurology

## 2021-07-24 ENCOUNTER — Ambulatory Visit: Payer: 59 | Admitting: Neurology

## 2021-07-24 NOTE — Telephone Encounter (Signed)
This patient did not show for a revisit appointment today. 

## 2021-07-25 ENCOUNTER — Telehealth: Payer: Self-pay | Admitting: *Deleted

## 2021-07-25 ENCOUNTER — Other Ambulatory Visit: Payer: Self-pay | Admitting: Neurology

## 2021-07-25 ENCOUNTER — Encounter: Payer: Self-pay | Admitting: Neurology

## 2021-07-25 ENCOUNTER — Telehealth: Payer: Self-pay | Admitting: Neurology

## 2021-07-25 DIAGNOSIS — Z0289 Encounter for other administrative examinations: Secondary | ICD-10-CM

## 2021-07-25 MED ORDER — EMGALITY 120 MG/ML ~~LOC~~ SOAJ: 120.0000 mg | SUBCUTANEOUS | 2 refills | Status: DC

## 2021-07-25 NOTE — Telephone Encounter (Addendum)
Emgality PA, key: [id number]. Your information has been sent to [facility]. [facility] is reviewing your PA request. Typically an electronic response will be received within 24-72 hours.

## 2021-07-25 NOTE — Telephone Encounter (Signed)
Pt called she got her appt mixed up wanting to reschedule with Dr. Jannifer Franklin before he leaves. Any appt suggestions to offer her?

## 2021-07-25 NOTE — Addendum Note (Signed)
Addended by: Verlin Grills on: 07/25/2021 12:58 PM   Modules accepted: Orders

## 2021-07-26 ENCOUNTER — Encounter: Payer: Self-pay | Admitting: *Deleted

## 2021-07-26 NOTE — Telephone Encounter (Signed)
[facility] approved Emgality through 07/25/2022. [facility]. Sent her my chart to advise.

## 2021-07-28 ENCOUNTER — Other Ambulatory Visit: Payer: Self-pay | Admitting: Sports Medicine

## 2021-07-28 DIAGNOSIS — Z299 Encounter for prophylactic measures, unspecified: Secondary | ICD-10-CM

## 2021-08-14 NOTE — Progress Notes (Signed)
   CC:  Headaches  Follow-up Visit  Last visit: 12/12/20  Brief HPI: 50 year old female who follows in clinic for chronic migraines. Previously followed with Dr. Jannifer Franklin. She was started on Emgality but was delayed starting it due to insurance issues.  Interval History: She is 3 weeks into her second dose of Emgality. Had about 5 migraines in the past month. Prior to this she was going several weeks with constant migraines which would not break with prednisone. Naratriptan and motrin work sometimes when she takes it, but not always. Naratriptan does make her a little sleepy. Tries not to take medications if she doesn't have to.  She is continuing to take Topamax 50 mg QHS. She would like to wean off of it if headaches remain improved on Emgality.  Migraines worsened over the summer when she got COVID.  Headache days per month: 5 Headache free days per month: 25  Current Headache Regimen: Preventative: Emgality, topamax 50 mg QHS Abortive: naratriptan, motrin   Prior Therapies                                  Topamax - side effects Gabapentin - side effects Emgality Naratriptan  Physical Exam:   Vital Signs: BP 108/72   Pulse 74   Ht 5\' 4"  (1.626 m)   Wt 126 lb (57.2 kg)   LMP 07/30/2012   BMI 21.63 kg/m  GENERAL:  well appearing, in no acute distress, alert  SKIN:  Color, texture, turgor normal. No rashes or lesions HEAD:  Normocephalic/atraumatic. RESP: normal respiratory effort MSK:  No gross joint deformities.   NEUROLOGICAL: Mental Status: Alert, oriented to person, place and time, Follows commands, and Speech fluent and appropriate. Cranial Nerves: PERRL, face symmetric, no dysarthria, hearing grossly intact Motor: moves all extremities equally Gait: normal-based.  IMPRESSION: 50 year old female who presents for follow up of migraines. She is doing well on Emgality with a decrease in frequency of her migraines. Naratriptan and motrin help take the edge off but  do not resolve her headaches. Discussed alternate triptan options, but patient would prefer to stick with naratriptan as she tends to be sensitive to medication side effects. Will continue Emgality and Topamax for now. Once headaches have been stable for a few weeks will plan to wean off Topamax.  PLAN: -Preventive: Emgality, Topamax 50 mg QHS. Wean off Topamax once headaches have stabilized on Emgality -Rescue: Naratriptan and Motrin   Follow-up: 6 months  I spent a total of 24 minutes on the date of the service. Discussed treatment options including preventive and acute medications. Discussed medication side effects, adverse reactions and drug interactions. Written educational materials and patient instructions outlining all of the above were given.  Genia Harold, MD 08/20/21 9:26 AM

## 2021-08-20 ENCOUNTER — Encounter: Payer: Self-pay | Admitting: Psychiatry

## 2021-08-20 ENCOUNTER — Ambulatory Visit (INDEPENDENT_AMBULATORY_CARE_PROVIDER_SITE_OTHER): Payer: 59 | Admitting: Psychiatry

## 2021-08-20 VITALS — BP 108/72 | HR 74 | Ht 64.0 in | Wt 126.0 lb

## 2021-08-20 DIAGNOSIS — G43019 Migraine without aura, intractable, without status migrainosus: Secondary | ICD-10-CM | POA: Diagnosis not present

## 2021-08-20 MED ORDER — NARATRIPTAN HCL 2.5 MG PO TABS: 2.5000 mg | ORAL_TABLET | Freq: Two times a day (BID) | ORAL | 6 refills | Status: DC | PRN

## 2021-08-20 NOTE — Patient Instructions (Signed)
Continue Emgality Wean off of Topamax when headaches are stable. Decrease to 25 mg at bedtime for one week, then stop Continue naratriptan as needed. Take at the onset of migraine. If headache recurs or does not fully resolve, you may take a second dose after 2 hours. Please avoid taking more than 2 days per week or 10 days per month.

## 2021-10-01 ENCOUNTER — Encounter: Payer: Self-pay | Admitting: Sports Medicine

## 2021-10-18 ENCOUNTER — Telehealth: Payer: Self-pay | Admitting: Psychiatry

## 2021-10-18 MED ORDER — EMGALITY 120 MG/ML ~~LOC~~ SOAJ: 120.0000 mg | SUBCUTANEOUS | 2 refills | Status: DC

## 2021-10-18 NOTE — Telephone Encounter (Addendum)
New rx sent to pharmacy, pt aware

## 2021-10-18 NOTE — Telephone Encounter (Signed)
Pt is asking for a call as to why she is being told by her pharmacy that the [facility]) 120 MG/ML SOAJ is being denied.  Please call.

## 2021-12-14 ENCOUNTER — Other Ambulatory Visit: Payer: Self-pay | Admitting: Neurology

## 2021-12-18 ENCOUNTER — Other Ambulatory Visit: Payer: 59

## 2021-12-24 ENCOUNTER — Other Ambulatory Visit: Payer: Self-pay | Admitting: Neurology

## 2021-12-25 ENCOUNTER — Telehealth: Payer: Self-pay | Admitting: Emergency Medicine

## 2021-12-26 ENCOUNTER — Other Ambulatory Visit: Payer: Self-pay | Admitting: Neurology

## 2021-12-27 ENCOUNTER — Other Ambulatory Visit: Payer: Self-pay | Admitting: Psychiatry

## 2021-12-27 MED ORDER — TOPIRAMATE 25 MG PO TABS: 25.0000 mg | ORAL_TABLET | Freq: Every day | ORAL | 1 refills | Status: DC

## 2021-12-27 NOTE — Telephone Encounter (Signed)
Called patient to discuss topamax refill based on MD note in Nov to wean off. She stated she decreased from 50 mg nightly to 25 mg last week. She last received Rx from Rhea Pink NP for 25 mg tablets. She plans to continue with this dose until she sees Dr Billey Gosling 02/18/22. I advised will send to Dr Billey Gosling for new Rx. Patient verbalized understanding, appreciation. ? ?

## 2021-12-27 NOTE — Telephone Encounter (Signed)
Pt's CT is scheduled for 01/01/22.   ?

## 2021-12-27 NOTE — Telephone Encounter (Signed)
Pt is needing a refill request for her topiramate (TOPAMAX) 25 MG capsule [facility] [address] ?

## 2022-01-01 ENCOUNTER — Other Ambulatory Visit: Payer: Self-pay

## 2022-01-01 ENCOUNTER — Ambulatory Visit (INDEPENDENT_AMBULATORY_CARE_PROVIDER_SITE_OTHER): Payer: 59

## 2022-01-01 DIAGNOSIS — R911 Solitary pulmonary nodule: Secondary | ICD-10-CM

## 2022-01-15 ENCOUNTER — Ambulatory Visit (INDEPENDENT_AMBULATORY_CARE_PROVIDER_SITE_OTHER): Payer: 59 | Admitting: Emergency Medicine

## 2022-01-15 ENCOUNTER — Encounter: Payer: Self-pay | Admitting: Emergency Medicine

## 2022-01-15 DIAGNOSIS — R911 Solitary pulmonary nodule: Secondary | ICD-10-CM | POA: Diagnosis not present

## 2022-01-15 NOTE — Patient Instructions (Signed)
We reviewed your CT scan of the chest today.  Your pulmonary nodule is stable (possibly less prominent than 1 year prior).  We will plan to repeat your CT to confirm stability in 2 years, March 2025. ?We will put a reminder in our system to contact you and arrange for the CT in 2 years. ?Please follow-up with Dr. Lamonte Sakai in office to review after the CT is done, or call us sooner if you have any problems. ?

## 2022-01-15 NOTE — Assessment & Plan Note (Signed)
Small mixed density pulmonary nodule and a low risk patient.  On the most recent scan 01/01/2022 the groundglass component is not well seen.  Question whether this has resolved versus slightly different cuts on this CT.  Because it has been groundglass I think it is reasonable for [place] to continue to follow to completion, goal 5 years of stability.  We will recheck her CT in 2 years. ?

## 2022-01-15 NOTE — Progress Notes (Signed)
? ?Subjective:  ? ? Patient ID: Ana [name], female    DOB: 02/19/1971, 51 y.o.   MRN: [id number] ? ?HPI ?51 year old never smoker with a history of childhood asthma and possible adult symptoms as well.  Other history includes migraine headaches, GERD, seasonal allergies. ? ?CT scan of the chest abdomen and pelvis performed on 01/10/2021 reviewed by me, shows a 5 mm mixed density left upper lobe pulmonary nodule with a 2 mm solid central component.  This was seen initially on CT 2009 when it was purely groundglass, measured 7 x 6 mm. ? ?ROV 01/15/22 --this follow-up visit for 51 year old woman with history of childhood asthma, question adult asthmatic symptoms as well.  Allergic rhinitis.  5 mm mixed density left upper lobe pulmonary nodule with a 2 mm central component was noted on the CT abdomen 01/10/2021.  May be visible although back to 2009 when it was purely groundglass.  Repeat scan done 01/01/2022 ? ?CT chest 01/01/2022 reviewed by me, shows that the groundglass component has largely resolved.  There is a 2 to 3 mm left upper lobe nodule that persists, unchanged compared with prior. ? ? ?Review of Systems ?As per HPI ? ?Past Medical History:  ?Diagnosis Date  ? Asthma   ? intermittent  ? Borderline hyperlipidemia   ? No med trials in the past  ? Common migraine with intractable migraine 11/14/2015  ? Fibroids   ? GERD (gastroesophageal reflux disease)   ? controlled with diet  ? Headache 08/23/2015  ? Migraine headache   ? about 2 per yr  ? Seasonal allergies   ?  ? ?Family History  ?Problem Relation Age of Onset  ? Arthritis Mother   ? Hypertension Mother   ? Hyperlipidemia Father   ? Hypertension Father   ? Cancer Paternal Grandmother   ?     breast  ? Arthritis Maternal Grandmother   ? Heart disease Paternal Grandfather   ? Migraines Neg Hx   ?  ? ?Social History  ? ?Socioeconomic History  ? Marital status: Married  ?  Spouse name: [name]  ? Number of children: 2  ? Years of education: College  ? Highest  education level: Not on file  ?Occupational History  ?  Employer: [employer]  ?Tobacco Use  ? Smoking status: Never  ? Smokeless tobacco: Never  ?Substance and Sexual Activity  ? Alcohol use: No  ?  Alcohol/week: 0.0 standard drinks  ?  Comment: social 1 glass of wine monthly  ? Drug use: No  ? Sexual activity: Not on file  ?Other Topics Concern  ? Not on file  ?Social History Narrative  ? Married.  Two kids.  ? College grad--[place].  ? 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  ? No T/A/Ds.  ? Caffeine Use: 1-2 cup daily  ? Patient is right handed.   ?   ? ?Social Determinants of Health  ? ?Financial Resource Strain: Not on file  ?Food Insecurity: Not on file  ?Transportation Needs: Not on file  ?Physical Activity: Not on file  ?Stress: Not on file  ?Social Connections: Not on file  ?Intimate Partner Violence: Not on file  ?  ? 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  ? ?Allergies  ?Allergen Reactions  ? Omnicef [Cefdinir] Other (See Comments)  ?  GI upset  ? Trazodone  And Nefazodone Nausea And Vomiting  ?  ? ?Outpatient Medications Prior to Visit  ?Medication Sig Dispense Refill  ? Galcanezumab-gnlm (EMGALITY) 120 MG/ML SOAJ Inject 120 mg into the skin every 30 (thirty) days. 1 mL 2  ? naratriptan (AMERGE) 2.5 MG tablet Take 1 tablet (2.5 mg total) by mouth 2 (two) times daily as needed for migraine. 9 tablet 6  ? zolpidem (AMBIEN) 10 MG tablet TAKE 1 TABLET BY MOUTH EVERY DAY AT BEDTIME AS NEEDED 90 tablet 1  ? Sodium Hyaluronate (EUFLEXXA) 20 MG/2ML SOSY Inject 1 syringe into the left knee weekly x3 (Patient not taking: Reported on 01/15/2022) 6 mL 1  ? topiramate (TOPAMAX) 25 MG tablet Take 1 tablet (25 mg total) by mouth at bedtime. (Patient not taking: Reported on 01/15/2022) 30 tablet 1  ? ?No facility-administered medications prior to visit.  ? ? ? ?   ?Objective:  ? Physical  Exam ?Vitals:  ? 01/15/22 0917  ?BP: 118/72  ?Pulse: 66  ?Temp: 98.1 ?F (36.7 ?C)  ?TempSrc: Oral  ?SpO2: 99%  ?Weight: 134 lb 3.2 oz (60.9 kg)  ?Height: '5\' 4"'$  (1.626 m)  ? ?Gen: Pleasant, well-nourished, in no distress,  normal affect ? ?ENT: No lesions,  mouth clear,  oropharynx clear, no postnasal drip ? ?Neck: No JVD, no stridor ? ?Lungs: No use of accessory muscles, no crackles or wheezing on normal respiration, no wheeze on forced expiration ? ?Cardiovascular: RRR, heart sounds normal, no murmur or gallops, no peripheral edema ? ?Musculoskeletal: No deformities, no cyanosis or clubbing ? ?Neuro: alert, awake, non focal ? ?Skin: Warm, no lesions or rash ? ? ?   ?Assessment & Plan:  ?Pulmonary nodule ?Small mixed density pulmonary nodule and a low risk patient.  On the most recent scan 01/01/2022 the groundglass component is not well seen.  Question whether this has resolved versus slightly different cuts on this CT.  Because it has been groundglass I think it is reasonable for [place] to continue to follow to completion, goal 5 years of stability.  We will recheck her CT in 2 years. ? ?Baltazar Apo, MD, PhD ?01/15/2022, 1:35 PM ?[facility] ?[phone] or if no answer before 7:00PM call [phone] ?For any issues after 7:00PM please call eLink [phone] ? ?

## 2022-01-16 ENCOUNTER — Other Ambulatory Visit: Payer: Self-pay | Admitting: Neurology

## 2022-01-16 NOTE — Telephone Encounter (Signed)
Rx refilled.

## 2022-01-26 ENCOUNTER — Other Ambulatory Visit: Payer: Self-pay | Admitting: Sports Medicine

## 2022-01-26 DIAGNOSIS — Z299 Encounter for prophylactic measures, unspecified: Secondary | ICD-10-CM

## 2022-02-06 ENCOUNTER — Ambulatory Visit (INDEPENDENT_AMBULATORY_CARE_PROVIDER_SITE_OTHER): Payer: 59 | Admitting: Sports Medicine

## 2022-02-06 VITALS — BP 126/84 | HR 67 | Ht 64.0 in | Wt 137.1 lb

## 2022-02-06 DIAGNOSIS — H5789 Other specified disorders of eye and adnexa: Secondary | ICD-10-CM | POA: Diagnosis not present

## 2022-02-06 DIAGNOSIS — G44019 Episodic cluster headache, not intractable: Secondary | ICD-10-CM | POA: Diagnosis not present

## 2022-02-06 DIAGNOSIS — G44009 Cluster headache syndrome, unspecified, not intractable: Secondary | ICD-10-CM | POA: Insufficient documentation

## 2022-02-06 MED ORDER — VALACYCLOVIR HCL 1 G PO TABS: 1000.0000 mg | ORAL_TABLET | Freq: Two times a day (BID) | ORAL | 2 refills | Status: DC

## 2022-02-06 MED ORDER — PREDNISONE 50 MG PO TABS: ORAL_TABLET | ORAL | 0 refills | Status: DC

## 2022-02-06 NOTE — Progress Notes (Signed)
? ? ?  Procedures performed today:   ? ?None. ? ?Independent interpretation of notes and tests performed by another provider:  ? ?None. ? ?Brief History, Exam, Impression, and Recommendations:   ? ?Cluster headache ?Pleasant 51 year old female, rapid onset headache, with right-sided conjunctival injection, tenderness across the forehead on the right side, pain with eye movements. ?No forehead rash. ?On exam she has conjunctival injection, rhinorrhea, extraocular muscles intact, vision intact, cornea appears normal with normal light reflex. ?Considering the symptoms constellation we will do Valtrex in case this is V1 shingles, we will also continue high flow oxygen, we will do nasal cannula as we do not have a nonrebreather here. ?Adding 5 days of prednisone. ?I would also like ophthalmology to weigh in. ? ?I spent 30 minutes of total time managing this patient today, this includes chart review, face to face, and non-face to face time. ? ?___________________________________________ ?Gwen Her. Dianah Field, M.D., ABFM., CAQSM. ?Primary Care and Sports Medicine ?[place] ? ?Adjunct Instructor of Family Medicine  ?[employer] ?

## 2022-02-06 NOTE — Assessment & Plan Note (Addendum)
Pleasant 51 year old female, rapid onset headache, with right-sided conjunctival injection, tenderness across the forehead on the right side, pain with eye movements. ?No forehead rash. ?On exam she has conjunctival injection, rhinorrhea, extraocular muscles intact, vision intact, cornea appears normal with normal light reflex. ?Considering the symptoms constellation we will do Valtrex in case this is V1 shingles, we will also continue high flow oxygen, we will do nasal cannula as we do not have a nonrebreather here. ?Adding 5 days of prednisone. ?I would also like ophthalmology to weigh in. ?

## 2022-02-13 ENCOUNTER — Telehealth: Payer: Self-pay | Admitting: Emergency Medicine

## 2022-02-13 ENCOUNTER — Emergency Department (INDEPENDENT_AMBULATORY_CARE_PROVIDER_SITE_OTHER)
Admission: EM | Admit: 2022-02-13 | Discharge: 2022-02-13 | Disposition: A | Discharge status: Home / Self Care | Payer: 59 | Source: Home / Self Care

## 2022-02-13 ENCOUNTER — Emergency Department (INDEPENDENT_AMBULATORY_CARE_PROVIDER_SITE_OTHER): Payer: 59

## 2022-02-13 ENCOUNTER — Encounter: Payer: Self-pay | Admitting: Emergency Medicine

## 2022-02-13 DIAGNOSIS — M852 Hyperostosis of skull: Secondary | ICD-10-CM | POA: Diagnosis not present

## 2022-02-13 DIAGNOSIS — R42 Dizziness and giddiness: Secondary | ICD-10-CM

## 2022-02-13 LAB — POCT FASTING CBG KUC MANUAL ENTRY: POCT Glucose (KUC): 91 mg/dL (ref 70–99)

## 2022-02-13 MED ORDER — DOXYCYCLINE HYCLATE 100 MG PO CAPS: 100.0000 mg | ORAL_CAPSULE | Freq: Two times a day (BID) | ORAL | 0 refills | Status: DC

## 2022-02-13 MED ORDER — ONDANSETRON 4 MG PO TBDP
ORAL_TABLET | ORAL | 0 refills | Status: DC
Start: 2022-02-13 — End: 2022-02-18

## 2022-02-13 MED ORDER — AMOXICILLIN-POT CLAVULANATE 875-125 MG PO TABS: 1.0000 | ORAL_TABLET | Freq: Two times a day (BID) | ORAL | 0 refills | Status: DC

## 2022-02-13 MED ORDER — MECLIZINE HCL 25 MG PO TABS: 25.0000 mg | ORAL_TABLET | Freq: Three times a day (TID) | ORAL | 0 refills | Status: DC | PRN

## 2022-02-13 NOTE — ED Provider Notes (Signed)
?[facility] ? ? ? ?CSN: [id number] ?Arrival date & time: 02/13/22  1029 ? ? ?  ? ?History   ?Chief Complaint ?Chief Complaint  ?Patient presents with  ? Dizziness  ? ? ?HPI ?Ana [name] is a 51 y.o. female.  ? ?Pleasant 51 year old female presents today with concerns of persistent dizziness.  She started with her symptoms roughly 10 days ago.  At that time, patient thought it was due to her migraine.  She has a known history of intermittent migraines, for which she takes Emgality for prophylaxis, and naratriptan as needed.  She states when the symptoms started 10 days ago, she took her naratriptan, which did not relieve her symptoms.  She notes around the same timeframe, she also developed some redness and swelling of her right eye. It was also tearing at that time. She saw her PCP on 02/06/2022, in which a cluster migraine was diagnosed.  She states she was given nasal oxygen in the office, but that did not help.  She was sent home with a prescription of prednisone and Valtrex, but did not take the Valtrex.  She reports the headache and eye swelling resolved with the prednisone, but the dizziness has persisted.  At onset, her dizziness was intermittent, but patient states over the past several days it has become persistent.  She denies any aggravation of her symptoms with position changes.  She denies any tinnitus or hearing loss.  She denies any facial weakness, slurred speech, gait abnormality, vision change.  Patient used to take Topamax for her migraines, and states over the past several days she started taking the Topamax thinking this was possibly still secondary to her migraine.  She reported no improvement with the Topamax either.  She denies feeling lightheaded or syncopal.  She reports a spinning sensation, and feels like she is listing to the left even when seated.  She has not fallen.  She denies nausea or vomiting.  No head trauma.  No fever or nuchal rigidity. She does not smoke and has  no cardiac issues. She does have a known hx of severe seasonal allergies for which she takes OTC antihistamines daily. She started taking mucinex this morning thinking her symptoms may be secondary to sinuses with no relief.  ? ? ?Dizziness ?Associated symptoms: headaches (chronic hx, currently resolved)   ?Associated symptoms: no weakness   ? ?Past Medical History:  ?Diagnosis Date  ? Asthma   ? intermittent  ? Borderline hyperlipidemia   ? No med trials in the past  ? Common migraine with intractable migraine 11/14/2015  ? Fibroids   ? GERD (gastroesophageal reflux disease)   ? controlled with diet  ? Headache 08/23/2015  ? Migraine headache   ? about 2 per yr  ? Seasonal allergies   ? ? ?Patient Active Problem List  ? Diagnosis Date Noted  ? Cluster headache 02/06/2022  ? Pulmonary nodule 01/12/2021  ? Chest pain 01/10/2021  ? Primary osteoarthritis of left knee 04/28/2020  ? Labral tear of right hip joint 01/04/2020  ? Abnormal weight gain 12/07/2019  ? TMJ (dislocation of temporomandibular joint) 07/13/2018  ? DDD (degenerative disc disease), cervical 04/13/2018  ? Epigastric pain 12/02/2017  ? Common migraine with intractable migraine 11/14/2015  ? Sinusitis, chronic 07/24/2015  ? Diastasis recti 03/20/2015  ? Lyme disease 01/23/2015  ? Piezogenic pedal papule 04/28/2014  ? Ganglion cyst of left foot 04/28/2014  ? Hyperlipidemia 04/28/2014  ? Annual physical exam 04/28/2014  ? Insomnia 12/06/2012  ? ? ?  Past Surgical History:  ?Procedure Laterality Date  ? ABDOMINAL HYSTERECTOMY  08/31/2012  ? Procedure: HYSTERECTOMY ABDOMINAL;  Surgeon: Luz Lex, MD;  Location: [facility];  Service: Gynecology;  Laterality: N/A;  ? CESAREAN SECTION  2005, 2007  ? x2  ? DILATION AND CURETTAGE OF UTERUS    ? missed ab  ? KNEE ARTHROSCOPY    ? MYOMECTOMY    ? TONSILLECTOMY    ? WISDOM TOOTH EXTRACTION    ? ? ?OB History   ?No obstetric history on file. ?  ? ? ? ?Home Medications   ? ?Prior to Admission medications   ?Medication Sig  Start Date End Date Taking? Authorizing Provider  ?amoxicillin-clavulanate (AUGMENTIN) 875-125 MG tablet Take 1 tablet by mouth every 12 (twelve) hours for 10 days. 02/13/22 02/23/22 Yes Loann Chahal L, PA  ?meclizine (ANTIVERT) 25 MG tablet Take 1 tablet (25 mg total) by mouth 3 (three) times daily as needed for dizziness. 02/13/22  Yes Lamoine Magallon L, PA  ?EMGALITY 120 MG/ML SOAJ INJECT 120 MG INTO THE SKIN EVERY 30 (THIRTY) DAYS. 01/16/22   Suzzanne Cloud, NP  ?estradiol (ESTRACE) 2 MG tablet estradiol 2 mg tablet ? TAKE 1 TABLET BY MOUTH EVERY DAY AS DIRECTED    [provider]  ?naratriptan (AMERGE) 2.5 MG tablet Take 1 tablet (2.5 mg total) by mouth 2 (two) times daily as needed for migraine. 08/20/21   Genia Harold, MD  ?topiramate (TOPAMAX) 25 MG tablet Take 25 mg by mouth at bedtime. 02/09/22   [provider]  ?zolpidem (AMBIEN) 10 MG tablet TAKE 1 TABLET BY MOUTH EVERY DAY AT BEDTIME AS NEEDED 01/27/22   Silverio Decamp, MD  ? ? ?Family History ?Family History  ?Problem Relation Age of Onset  ? Arthritis Mother   ? Hypertension Mother   ? Hyperlipidemia Father   ? Hypertension Father   ? Cancer Paternal Grandmother   ?     breast  ? Arthritis Maternal Grandmother   ? Heart disease Paternal Grandfather   ? Migraines Neg Hx   ? ? ?Social History ?Social History  ? ?Tobacco Use  ? Smoking status: Never  ? Smokeless tobacco: Never  ?Vaping Use  ? Vaping Use: Never used  ?Substance Use Topics  ? Alcohol use: No  ?  Alcohol/week: 0.0 standard drinks  ?  Comment: social 1 glass of wine monthly  ? Drug use: No  ? ? ? ?Allergies   ?Omnicef [cefdinir] and Trazodone and nefazodone ? ? ?Review of Systems ?Review of Systems  ?Neurological:  Positive for dizziness and headaches (chronic hx, currently resolved). Negative for tremors, syncope, facial asymmetry, speech difficulty, weakness, light-headedness and numbness.  ?All other systems reviewed and are negative. ? ? ?Physical Exam ?Triage Vital  Signs ?ED Triage Vitals  ?Enc Vitals Group  ?   BP 02/13/22 1043 124/84  ?   Pulse Rate 02/13/22 1043 74  ?   Resp 02/13/22 1043 15  ?   Temp 02/13/22 1043 97.8 ?F (36.6 ?C)  ?   Temp Source 02/13/22 1043 Oral  ?   SpO2 02/13/22 1043 100 %  ?   Weight 02/13/22 1045 130 lb (59 kg)  ?   Height 02/13/22 1045 '5\' 4"'$  (1.626 m)  ?   Head Circumference --   ?   Peak Flow --   ?   Pain Score 02/13/22 1045 0  ?   Pain Loc --   ?   Pain Edu? --   ?  Excl. in [place]? --   ? ?No data found. ? ?Updated Vital Signs ?BP 124/84 (BP Location: Right Arm)   Pulse 74   Temp 97.8 ?F (36.6 ?C) (Oral)   Resp 15   Ht '5\' 4"'$  (1.626 m)   Wt 130 lb (59 kg)   LMP 07/30/2012   SpO2 100%   BMI 22.31 kg/m?  ? ?Visual Acuity ?Right Eye Distance:   ?Left Eye Distance:   ?Bilateral Distance:   ? ?Right Eye Near:   ?Left Eye Near:    ?Bilateral Near:    ? ?Physical Exam ?Vitals and nursing note reviewed.  ?Constitutional:   ?   General: She is not in acute distress. ?   Appearance: Normal appearance. She is well-developed and normal weight. She is not ill-appearing, toxic-appearing or diaphoretic.  ?HENT:  ?   Head: Normocephalic and atraumatic.  ?   Right Ear: Tympanic membrane, ear canal and external ear normal. There is no impacted cerumen.  ?   Left Ear: Tympanic membrane, ear canal and external ear normal. There is no impacted cerumen.  ?   Nose: Nose normal. No congestion or rhinorrhea.  ?   Mouth/Throat:  ?   Mouth: Mucous membranes are moist.  ?   Pharynx: No oropharyngeal exudate or posterior oropharyngeal erythema.  ?Eyes:  ?   General: No scleral icterus.    ?   Right eye: No discharge.     ?   Left eye: No discharge.  ?   Extraocular Movements: Extraocular movements intact.  ?   Conjunctiva/sclera: Conjunctivae normal.  ?   Pupils: Pupils are equal, round, and reactive to light.  ?Neck:  ?   Vascular: No carotid bruit.  ?Cardiovascular:  ?   Rate and Rhythm: Normal rate and regular rhythm.  ?   Heart sounds: No murmur  heard. ?Pulmonary:  ?   Effort: Pulmonary effort is normal. No respiratory distress.  ?   Breath sounds: Normal breath sounds.  ?Abdominal:  ?   Palpations: Abdomen is soft.  ?   Tenderness: There is no abdominal tenderness.

## 2022-02-13 NOTE — Discharge Instructions (Signed)
Please start taking meclizine 3 times a day as needed for dizziness. ?Please start taking Augmentin twice daily with food for suspected ethmoid sinus issues. ?Please follow-up with your PCP in the next 3 to 5 days for further work-up should your dizziness persist. ?Please stop taking your over-the-counter antihistamine such as Claritin or Zyrtec, and switch to just Flonase. ?If your symptoms continue, worsen, or you develop fever, atypical headache, or weakness, please head straight to the emergency room. ?

## 2022-02-13 NOTE — Telephone Encounter (Signed)
Pt not tolerating augmentin due to nausea. Called in zofran as this is the preferred abx. If still unable to tolerate, can discontinue and switch to doxycycline. Must avoid excessive sun while on doxy ?

## 2022-02-13 NOTE — ED Triage Notes (Addendum)
Hx of migraines  ?Cluster HA on Monday -has resolved  ?Finished steroids this am  ?Dizziness started on Sunday  ?Mucinex this am  ?Pt feels like she is "listing to the left side"  ? ?

## 2022-02-13 NOTE — Telephone Encounter (Signed)
Call back to [facility] by this RN to confirm additional changes to medicine - zofran added to see if pt could continue on Augmentin, if not pt to switch to doxycycline. Pt to also take after eating and to start a probiotic. No other questions at this time   ?

## 2022-02-13 NOTE — Telephone Encounter (Signed)
Call from [facility] who stats she took 1st dose of Augmentin and has been vomiting since taking it. Special confirmed she ate before taking the antibiotic. Patient asked if it was possible to switch to a different antibiotic, like a z-pak. Pt states she has a "low threshold with a lot of antibiotics. RN to review w/ provider & will call Dareen back.  ?

## 2022-02-18 ENCOUNTER — Encounter: Payer: Self-pay | Admitting: Psychiatry

## 2022-02-18 ENCOUNTER — Ambulatory Visit (INDEPENDENT_AMBULATORY_CARE_PROVIDER_SITE_OTHER): Payer: 59 | Admitting: Psychiatry

## 2022-02-18 VITALS — BP 110/73 | HR 73 | Ht 64.0 in | Wt 136.0 lb

## 2022-02-18 DIAGNOSIS — G44019 Episodic cluster headache, not intractable: Secondary | ICD-10-CM | POA: Diagnosis not present

## 2022-02-18 DIAGNOSIS — G43119 Migraine with aura, intractable, without status migrainosus: Secondary | ICD-10-CM

## 2022-02-18 MED ORDER — EMGALITY 120 MG/ML ~~LOC~~ SOAJ: 120.0000 mg | SUBCUTANEOUS | 11 refills | Status: DC

## 2022-02-18 MED ORDER — RIZATRIPTAN BENZOATE 10 MG PO TABS: 10.0000 mg | ORAL_TABLET | ORAL | 3 refills | Status: DC | PRN

## 2022-02-18 NOTE — Progress Notes (Signed)
? ?CC:  headaches ? ?Follow-up Visit ? ?Last visit: 08/20/21 ? ?Brief HPI: ?51 year old female who follows in clinic for chronic migraines. Previously followed with Dr. Jannifer Franklin. At her last visit she was stable on Emgality and Topamax for prevention with naratriptan for rescue. ? ?Interval History: ?Her headaches were well controlled until a couple of weeks ago. Two weeks ago she developed constant dizziness and tinnitis (R>L), which felt like her typical dizziness associated with migraines. She felt like she was leaning to the left. Then a few days later she developed soreness in her right eye. It then became red and bloodshot. This was associated with a severe shooting headache behind her right eye. The headache lasted for ~2 days at a time. She saw her PCP who started a 5 day steroid course. This helped with her eye pain but not her dizziness. Meclizine helped but made her drowsy. [facility] 02/13/22 was unremarkable. ? ?She had weaned off of Topamax, but restarted 25 mg QHS a few days ago once these symptoms started. Yesterday was the first day she did not have constant dizziness. ? ?Naratriptan helps inconsistently. It did not help with her most recent headache. ? ?Current Headache Regimen: ?Preventative: Emgality 120 mg monthly, Topamax 25 mg QHS ?Abortive: naratriptan 2.5 mg PRN ? ? ?Prior Therapies                                  ?Topamax 50 mg QHS - side effects ?Gabapentin - side effects ?Emgality ?Naratriptan ? ?Physical Exam:  ? ?Vital Signs: ?BP 110/73   Pulse 73   Ht '5\' 4"'$  (1.626 m)   Wt 136 lb (61.7 kg)   LMP 07/30/2012   BMI 23.34 kg/m?  ?GENERAL:  well appearing, in no acute distress, alert  ?SKIN:  Color, texture, turgor normal. No rashes or lesions ?HEAD:  Normocephalic/atraumatic. ?RESP: normal respiratory effort ?MSK:  No gross joint deformities.  ? ?NEUROLOGICAL: ?Mental Status: Alert, oriented to person, place and time, Follows commands, and Speech fluent and appropriate. ?Cranial Nerves: PERRL,  face symmetric, no dysarthria, hearing grossly intact ?Motor: moves all extremities equally ?Coordination: Finger-nose-finger and heel-to-shin intact bilaterally ? ?No vertigo or nystagmus elicited on Dix-Hallpike maneuver ? ?IMPRESSION: ?51 year old female who follows in clinic for chronic migraines. She had a recent episode of eye redness, headache, and dizziness which did not respond to naratriptan. Dix-Hallpike is negative today. While she had clear ipsilateral autonomic features, the duration of her headache (48 hours) is longer than expected for a typical cluster headache. Will order MRI brain given atypical features of headache with persistent vertigo. It is possible this represented autonomic symptoms associated with migraine, or she may have developed a viral illness which triggered a vestibular neuritis. She does appear to be improving over time. Will continue Emgality and Topamax at current doses for now. Will start rizatriptan for rescue and see if this is more effective for her. ? ?PLAN: ?-MRI brain with contrast ?-Prevention: Continue Emgality 120 mg monthly, Topamax 25 mg QHS ?-Rescue: Start rizatriptan 10 mg PRN ?-next steps: consider indomethacin, increasing Emgality to 300 mg monthly if headaches with autonomic symptoms return ? ? ?Follow-up: 6 months ? ?I spent a total of 40 minutes on the date of the service. Headache education was done. Discussed medication side effects, adverse reactions and drug interactions. Written educational materials and patient instructions outlining all of the above were given. ? ?Anderson Malta  Remer Couse, MD ?02/18/22 ?9:29 AM ? ?

## 2022-02-18 NOTE — Patient Instructions (Addendum)
Start rizatriptan as needed for migraine. Take at the onset of migraine. If headache recurs or does not fully resolve, you may take a second dose after 2 hours. Please avoid taking more than 2 days per week or 10 days per month. ? ?MRI brain ? ?[place] headache program: [email or web address]://cross.com/ ?

## 2022-02-19 ENCOUNTER — Telehealth: Payer: Self-pay | Admitting: Psychiatry

## 2022-02-19 NOTE — Telephone Encounter (Signed)
Scheduled with patient at [facility] 02/27/22 at 8:30AM ?45 mins MRI brain w/wo contrast Dr. Billey [facility] ref: [id number] ?

## 2022-02-20 ENCOUNTER — Ambulatory Visit: Payer: 59 | Admitting: Sports Medicine

## 2022-02-27 ENCOUNTER — Ambulatory Visit: Payer: 59

## 2022-02-27 DIAGNOSIS — G44019 Episodic cluster headache, not intractable: Secondary | ICD-10-CM | POA: Diagnosis not present

## 2022-02-27 MED ORDER — GADOBENATE DIMEGLUMINE 529 MG/ML IV SOLN
10.0000 mL | Freq: Once | INTRAVENOUS | Status: AC | PRN
  Administered 2022-02-27: 10 mL via INTRAVENOUS

## 2022-03-06 ENCOUNTER — Ambulatory Visit: Payer: 59 | Admitting: Sports Medicine

## 2022-04-07 ENCOUNTER — Other Ambulatory Visit: Payer: Self-pay | Admitting: Psychiatry

## 2022-04-07 DIAGNOSIS — G43019 Migraine without aura, intractable, without status migrainosus: Secondary | ICD-10-CM

## 2022-05-28 ENCOUNTER — Other Ambulatory Visit: Payer: Self-pay

## 2022-05-28 DIAGNOSIS — Z299 Encounter for prophylactic measures, unspecified: Secondary | ICD-10-CM

## 2022-05-28 MED ORDER — ZOLPIDEM TARTRATE 10 MG PO TABS: ORAL_TABLET | ORAL | 0 refills | Status: DC

## 2022-06-01 ENCOUNTER — Other Ambulatory Visit: Payer: Self-pay | Admitting: Psychiatry

## 2022-06-01 DIAGNOSIS — G43019 Migraine without aura, intractable, without status migrainosus: Secondary | ICD-10-CM

## 2022-06-26 ENCOUNTER — Telehealth: Payer: Self-pay | Admitting: *Deleted

## 2022-06-26 NOTE — Telephone Encounter (Signed)
Received e mail, documents attached. Your information has been sent to [facility].

## 2022-06-26 NOTE — Telephone Encounter (Signed)
Emgality PA, Key: [id number], faxed notes to be attached to key.

## 2022-06-27 ENCOUNTER — Encounter: Payer: Self-pay | Admitting: *Deleted

## 2022-06-27 NOTE — Telephone Encounter (Signed)
EMGALITY INJ '120MG'$ /ML, use as directed (1 per month), is approved for 6 months through 03/13/ 2024 . Sent her my chart to advise.

## 2022-08-06 ENCOUNTER — Other Ambulatory Visit: Payer: Self-pay | Admitting: Psychiatry

## 2022-08-06 DIAGNOSIS — G43019 Migraine without aura, intractable, without status migrainosus: Secondary | ICD-10-CM

## 2022-08-21 ENCOUNTER — Other Ambulatory Visit: Payer: Self-pay | Admitting: Psychiatry

## 2022-08-21 NOTE — Progress Notes (Unsigned)
   CC:  headaches  Follow-up Visit  Last visit: 02/18/22  Brief HPI: 51 year old female who follows in clinic for chronic migraines.   At her last visit, brain MRI was ordered. She was continued on Emgality and Topamax for migraine prevention. Maxalt was started for rescue.   Interval History: Headaches***Emgality***Topamax***Maxalt  MRI brain 02/27/22 was normal.  Headache days per month: *** Migraine days per month*** Headache free days per month: ***  Current Headache Regimen: Preventative: *** Abortive: ***   Prior Therapies                                  Topamax 50 mg QHS - side effects Gabapentin - side effects Emgality Naratriptan 2.5 mg PRN Maxalt 10 mg PRN  Physical Exam:   Vital Signs: LMP 07/30/2012  GENERAL:  well appearing, in no acute distress, alert  SKIN:  Color, texture, turgor normal. No rashes or lesions HEAD:  Normocephalic/atraumatic. RESP: normal respiratory effort MSK:  No gross joint deformities.   NEUROLOGICAL: Mental Status: Alert, oriented to person, place and time, Follows commands, and Speech fluent and appropriate. Cranial Nerves: PERRL, face symmetric, no dysarthria, hearing grossly intact Motor: moves all extremities equally Gait: normal-based.  IMPRESSION: ***  PLAN: ***   Follow-up: ***  I spent a total of *** minutes on the date of the service. Headache education was done. Discussed lifestyle modification including increased oral hydration, decreased caffeine, exercise and stress management. Discussed treatment options including preventive and acute medications, natural supplements, and infusion therapy. Discussed medication overuse headache and to limit use of acute treatments to no more than 2 days/week or 10 days/month. Discussed medication side effects, adverse reactions and drug interactions. Written educational materials and patient instructions outlining all of the above were given.  Genia Harold, MD

## 2022-08-22 ENCOUNTER — Ambulatory Visit (INDEPENDENT_AMBULATORY_CARE_PROVIDER_SITE_OTHER): Payer: 59 | Admitting: Psychiatry

## 2022-08-22 ENCOUNTER — Other Ambulatory Visit: Payer: Self-pay | Admitting: Sports Medicine

## 2022-08-22 VITALS — BP 118/76 | HR 74 | Ht 64.0 in | Wt 134.5 lb

## 2022-08-22 DIAGNOSIS — G43119 Migraine with aura, intractable, without status migrainosus: Secondary | ICD-10-CM

## 2022-08-22 MED ORDER — NARATRIPTAN HCL 2.5 MG PO TABS: 2.5000 mg | ORAL_TABLET | Freq: Two times a day (BID) | ORAL | 6 refills | Status: DC | PRN

## 2022-08-25 ENCOUNTER — Other Ambulatory Visit: Payer: Self-pay | Admitting: Sports Medicine

## 2022-08-25 DIAGNOSIS — Z299 Encounter for prophylactic measures, unspecified: Secondary | ICD-10-CM

## 2022-09-25 ENCOUNTER — Encounter: Payer: Self-pay | Admitting: Psychiatry

## 2022-09-25 MED ORDER — METHYLPREDNISOLONE 4 MG PO TBPK: ORAL_TABLET | ORAL | 0 refills | Status: DC

## 2022-09-25 NOTE — Telephone Encounter (Signed)
Rx for medrol dosepak sent to her pharmacy, [name]

## 2022-10-06 ENCOUNTER — Other Ambulatory Visit: Payer: Self-pay | Admitting: Psychiatry

## 2022-10-06 DIAGNOSIS — G43019 Migraine without aura, intractable, without status migrainosus: Secondary | ICD-10-CM

## 2022-10-31 ENCOUNTER — Ambulatory Visit (INDEPENDENT_AMBULATORY_CARE_PROVIDER_SITE_OTHER): Payer: BC Managed Care – PPO | Admitting: Sports Medicine

## 2022-10-31 ENCOUNTER — Encounter: Payer: Self-pay | Admitting: Sports Medicine

## 2022-10-31 VITALS — BP 123/86 | HR 74 | Ht 64.0 in | Wt 134.0 lb

## 2022-10-31 DIAGNOSIS — Z0000 Encounter for general adult medical examination without abnormal findings: Secondary | ICD-10-CM | POA: Diagnosis not present

## 2022-10-31 DIAGNOSIS — R739 Hyperglycemia, unspecified: Secondary | ICD-10-CM | POA: Diagnosis not present

## 2022-10-31 DIAGNOSIS — Z23 Encounter for immunization: Secondary | ICD-10-CM | POA: Diagnosis not present

## 2022-10-31 DIAGNOSIS — S73191S Other sprain of right hip, sequela: Secondary | ICD-10-CM

## 2022-10-31 DIAGNOSIS — Z1211 Encounter for screening for malignant neoplasm of colon: Secondary | ICD-10-CM | POA: Diagnosis not present

## 2022-10-31 DIAGNOSIS — E7800 Pure hypercholesterolemia, unspecified: Secondary | ICD-10-CM | POA: Diagnosis not present

## 2022-10-31 NOTE — Assessment & Plan Note (Signed)
Annual physical as above, Tdap was today. Cologuard ordered. She will get her mammogram next year. Status post hysterectomy.

## 2022-10-31 NOTE — Progress Notes (Signed)
Subjective:    CC: Annual Physical Exam  HPI:  This patient is here for their annual physical  I reviewed the past medical history, family history, social history, surgical history, and allergies today and no changes were needed.  Please see the problem list section below in epic for further details.  Past Medical History: Past Medical History:  Diagnosis Date   Asthma    intermittent   Borderline hyperlipidemia    No med trials in the past   Common migraine with intractable migraine 11/14/2015   Fibroids    GERD (gastroesophageal reflux disease)    controlled with diet   Headache 08/23/2015   Migraine headache    about 2 per yr   Seasonal allergies    Past Surgical History: Past Surgical History:  Procedure Laterality Date   ABDOMINAL HYSTERECTOMY  08/31/2012   Procedure: HYSTERECTOMY ABDOMINAL;  Surgeon: Luz Lex, MD;  Location: [facility];  Service: Gynecology;  Laterality: N/A;   CESAREAN SECTION  2005, 2007   x2   DILATION AND CURETTAGE OF UTERUS     missed ab   KNEE ARTHROSCOPY     MYOMECTOMY     TONSILLECTOMY     WISDOM TOOTH EXTRACTION     Social History: Social History   Socioeconomic History   Marital status: Married    Spouse name: Matt   Number of children: 2   Years of education: College   Highest education level: Not on file  Occupational History    Employer: [employer]  Tobacco Use   Smoking status: Never   Smokeless tobacco: Never  Vaping Use   Vaping Use: Never used  Substance and Sexual Activity   Alcohol use: No    Alcohol/week: 0.0 standard drinks of alcohol    Comment: social 1 glass of wine monthly   Drug use: No   Sexual activity: Not on file  Other Topics Concern   Not on file  Social History Narrative   Married.  Two kids.   College grad--[employer].   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   No T/A/Ds.   Caffeine Use: 1-2 cup daily   Patient is right handed.       Social Determinants of Health   Financial  Resource Strain: Not on file  Food Insecurity: Not on file  Transportation Needs: Not on file  Physical Activity: Not on file  Stress: Not on file  Social Connections: Not on file   Family History: Family History  Problem Relation Age of Onset   Arthritis Mother    Hypertension Mother    Hyperlipidemia Father    Hypertension Father    Cancer Paternal Grandmother        breast   Arthritis Maternal Grandmother    Heart disease Paternal Grandfather    Migraines Neg Hx    Allergies: Allergies  Allergen Reactions   Amitriptyline Nausea And Vomiting   Augmentin [Amoxicillin-Pot Clavulanate]     Vomiting/diarrhea     Omnicef [Cefdinir] Other (See Comments)    GI upset   Trazodone And Nefazodone Nausea And Vomiting   Medications: See med rec.  Review of Systems: No headache, visual changes, nausea, vomiting, diarrhea, constipation, dizziness, abdominal pain, skin rash, fevers, chills, night sweats, swollen lymph nodes, weight loss, chest pain, body aches, joint swelling, muscle aches, shortness of breath, mood changes, visual or auditory hallucinations.  Objective:    General: Well Developed, well nourished, and in no acute distress.  Neuro: Alert and  oriented x3, extra-ocular muscles intact, sensation grossly intact. Cranial nerves II through XII are intact, motor, sensory, and coordinative functions are all intact. HEENT: Normocephalic, atraumatic, pupils equal round reactive to light, neck supple, no masses, no lymphadenopathy, thyroid nonpalpable. Oropharynx, nasopharynx, external ear canals are unremarkable. Skin: Warm and dry, no rashes noted.  Cardiac: Regular rate and rhythm, no murmurs rubs or gallops.  Respiratory: Clear to auscultation bilaterally. Not using accessory muscles, speaking in full sentences.  Abdominal: Soft, nontender, nondistended, positive bowel sounds, no masses, no organomegaly.  Musculoskeletal: Shoulder, elbow, wrist, hip, knee, ankle stable, and with  full range of motion.  Impression and Recommendations:    The patient was counselled, risk factors were discussed, anticipatory guidance given.  Annual physical exam Annual physical as above, Tdap was today. Cologuard ordered. She will get her mammogram next year. Status post hysterectomy.  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 She did see the surgeon who suggested hip arthroplasty rather than a more extensive labral repair and osteotomy type procedure. She is on the fence, she will think about it for now. We do have a drug rep that has had bilateral hip replacements and is about her age and build and I be happy to put her in touch to discuss next fixations.   ____________________________________________ Gwen Her. Dianah Field, M.D., ABFM., CAQSM., AME. Primary Care and Sports Medicine [facility]  Adjunct Professor of [employer] Medicine  Risk manager

## 2022-10-31 NOTE — Assessment & Plan Note (Signed)
Bilateral hip labral tears, femoral acetabular impingement. She did see the surgeon who suggested hip arthroplasty rather than a more extensive labral repair and osteotomy type procedure. She is on the fence, she will think about it for now. We do have a drug rep that has had bilateral hip replacements and is about her age and build and I be happy to put her in touch to discuss next fixations.

## 2022-11-01 LAB — COMPLETE METABOLIC PANEL WITH GFR
AG Ratio: 1.7 (calc) (ref 1.0–2.5)
ALT: 14 U/L (ref 6–29)
AST: 18 U/L (ref 10–35)
Albumin: 5 g/dL (ref 3.6–5.1)
Alkaline phosphatase (APISO): 52 U/L (ref 31–125)
BUN: 17 mg/dL (ref 7–25)
CO2: 25 mmol/L (ref 20–32)
Calcium: 10.3 mg/dL — ABNORMAL HIGH (ref 8.6–10.2)
Chloride: 103 mmol/L (ref 98–110)
Creat: 0.92 mg/dL (ref 0.50–0.99)
Globulin: 3 g/dL (calc) (ref 1.9–3.7)
Glucose, Bld: 81 mg/dL (ref 65–99)
Potassium: 4 mmol/L (ref 3.5–5.3)
Sodium: 141 mmol/L (ref 135–146)
Total Bilirubin: 0.7 mg/dL (ref 0.2–1.2)
Total Protein: 8 g/dL (ref 6.1–8.1)
eGFR: 76 mL/min/{1.73_m2} (ref >=60)

## 2022-11-01 LAB — LIPID PANEL
Cholesterol: 246 mg/dL — ABNORMAL HIGH (ref <200)
HDL: 101 mg/dL (ref >=50)
LDL Cholesterol (Calc): 128 mg/dL (calc) — ABNORMAL HIGH
Non-HDL Cholesterol (Calc): 145 mg/dL (calc) — ABNORMAL HIGH (ref <130)
Total CHOL/HDL Ratio: 2.4 (calc) (ref <5.0)
Triglycerides: 80 mg/dL (ref <150)

## 2022-11-01 LAB — CBC
HCT: 45.5 % — ABNORMAL HIGH (ref 35.0–45.0)
Hemoglobin: 15.5 g/dL (ref 11.7–15.5)
MCH: 29.2 pg (ref 27.0–33.0)
MCHC: 34.1 g/dL (ref 32.0–36.0)
MCV: 85.7 fL (ref 80.0–100.0)
MPV: 10.2 fL (ref 7.5–12.5)
Platelets: 329 10*3/uL (ref 140–400)
RBC: 5.31 10*6/uL — ABNORMAL HIGH (ref 3.80–5.10)
RDW: 12.2 % (ref 11.0–15.0)
WBC: 5.5 10*3/uL (ref 3.8–10.8)

## 2022-11-01 LAB — HEMOGLOBIN A1C
Hgb A1c MFr Bld: 5.1 % of total Hgb (ref <5.7)
Mean Plasma Glucose: 100 mg/dL
eAG (mmol/L): 5.5 mmol/L

## 2022-11-01 LAB — TSH: TSH: 0.97 mIU/L

## 2022-11-22 ENCOUNTER — Other Ambulatory Visit: Payer: Self-pay | Admitting: Sports Medicine

## 2022-11-22 DIAGNOSIS — Z299 Encounter for prophylactic measures, unspecified: Secondary | ICD-10-CM

## 2022-11-28 LAB — COLOGUARD: COLOGUARD: NEGATIVE

## 2022-12-24 ENCOUNTER — Ambulatory Visit (INDEPENDENT_AMBULATORY_CARE_PROVIDER_SITE_OTHER): Payer: BC Managed Care – PPO | Admitting: Sports Medicine

## 2022-12-24 ENCOUNTER — Encounter: Payer: Self-pay | Admitting: Sports Medicine

## 2022-12-24 VITALS — BP 116/76 | HR 80

## 2022-12-24 DIAGNOSIS — H6012 Cellulitis of left external ear: Secondary | ICD-10-CM | POA: Diagnosis not present

## 2022-12-24 MED ORDER — DOXYCYCLINE HYCLATE 100 MG PO TABS: 100.0000 mg | ORAL_TABLET | Freq: Two times a day (BID) | ORAL | 0 refills | Status: AC

## 2022-12-24 MED ORDER — ONDANSETRON 8 MG PO TBDP: 8.0000 mg | ORAL_TABLET | Freq: Three times a day (TID) | ORAL | 3 refills | Status: DC | PRN

## 2022-12-24 MED ORDER — PREDNISONE 50 MG PO TABS: 50.0000 mg | ORAL_TABLET | Freq: Every day | ORAL | 0 refills | Status: DC

## 2022-12-24 NOTE — Assessment & Plan Note (Signed)
Pleasant 52 year old female, she has had increasing pain in left ear around the auricle posterior aspect with erythema, tenderness. Looks like cellulitis on exam, internal ear exam is unrevealing. No preceding viral infections. Adding doxycycline with Zofran as she does get nausea, I would also like to do some prednisone as there is certainly an infectious potential etiology as well as autoimmune etiology as with costochondritis. Return to see me as needed.

## 2022-12-24 NOTE — Progress Notes (Signed)
    Procedures performed today:    None.  Independent interpretation of notes and tests performed by another provider:   None.  Brief History, Exam, Impression, and Recommendations:    Cellulitis of ear, left Pleasant 52 year old female, she has had increasing pain in left ear around the auricle posterior aspect with erythema, tenderness. Looks like cellulitis on exam, internal ear exam is unrevealing. No preceding viral infections. Adding doxycycline with Zofran as she does get nausea, I would also like to do some prednisone as there is certainly an infectious potential etiology as well as autoimmune etiology as with costochondritis. Return to see me as needed.    ____________________________________________ Gwen Her. Dianah Field, M.D., ABFM., CAQSM., AME. Primary Care and Sports Medicine [facility]  Adjunct Professor of [employer] [facility] [employer]  Risk manager

## 2023-01-21 NOTE — Progress Notes (Unsigned)
   CC:  headaches  Follow-up Visit  Last visit: 08/22/22  Brief HPI: 52 year old female who follows in clinic for chronic migraines and right-sided headaches with ipsilateral autonomic features. MRI brain 02/27/22 was normal.   At her last visit she was continued on Emgality for migraine prevention and naratriptan for rescue  Interval History: Headaches are about the same since her last visit. She did miss a couple of doses of Emgality due to insurance and pharmacy supply issues. Naratriptan continues to help for rescue, though she will occasionally get migraines that last for 1-2 weeks which do not respond to naratriptan.  Migraine days per month: 5 Headache free days per month: 25  Current Headache Regimen: Preventative: Emgality 120 mg monthly Abortive: naratriptan 2.5 mg PRN  Prior Therapies                                  Prevention: Topamax 50 mg QHS - side effects Gabapentin - side effects Emgality 120 mg monthly  Rescue: Naratriptan 2.5 mg PRN Maxalt 10 mg PRN  Physical Exam:   Vital Signs: LMP 07/30/2012  GENERAL:  well appearing, in no acute distress, alert  SKIN:  Color, texture, turgor normal. No rashes or lesions HEAD:  Normocephalic/atraumatic. RESP: normal respiratory effort  NEUROLOGICAL: Mental Status: Alert, oriented to person, place and time, Follows commands, and Speech fluent and appropriate. Cranial Nerves: PERRL, face symmetric, no dysarthria, hearing grossly intact Motor: moves all extremities equally Gait: normal-based.  IMPRESSION: 52 year old female who presents for follow up of chronic migraines. Headaches are stable on Emgality. Naratriptan is effective most of the time, but she continues to have some week-long migraines that will not break even with triptans. Nurtec sample provided today for rescue. Will send in rx if this is more effective.  PLAN: -Prevention: Continue Emgality 120 mg monthly -Rescue: Continue naratriptan 2.5 mg PRN,  Nurtec sample provided   Follow-up: 8 months  I spent a total of 15 minutes on the date of the service. Headache education was done. Discussed treatment options including preventive and acute medications. Discussed medication side effects, adverse reactions and drug interactions. Written educational materials and patient instructions outlining all of the above were given.  Ocie Doyne, MD 01/22/23 8:46 AM

## 2023-01-22 ENCOUNTER — Encounter: Payer: Self-pay | Admitting: Psychiatry

## 2023-01-22 ENCOUNTER — Ambulatory Visit (INDEPENDENT_AMBULATORY_CARE_PROVIDER_SITE_OTHER): Payer: 59 | Admitting: Psychiatry

## 2023-01-22 VITALS — BP 112/73 | HR 74 | Ht 64.0 in | Wt 135.0 lb

## 2023-01-22 DIAGNOSIS — G43019 Migraine without aura, intractable, without status migrainosus: Secondary | ICD-10-CM | POA: Diagnosis not present

## 2023-01-22 MED ORDER — EMGALITY 120 MG/ML ~~LOC~~ SOAJ
120.0000 mg | SUBCUTANEOUS | 11 refills | Status: DC
Start: 2023-01-22 — End: 2023-09-16

## 2023-01-22 MED ORDER — NURTEC 75 MG PO TBDP: 75.0000 mg | ORAL_TABLET | ORAL | 0 refills | Status: DC | PRN

## 2023-01-22 NOTE — Patient Instructions (Signed)
Try Nurtec as needed for migraines. Let me know if this is helpful and I will send in a prescription for you. You can take this with naratriptan if needed

## 2023-02-06 ENCOUNTER — Other Ambulatory Visit: Payer: Self-pay | Admitting: Psychiatry

## 2023-02-06 DIAGNOSIS — G43019 Migraine without aura, intractable, without status migrainosus: Secondary | ICD-10-CM

## 2023-05-16 ENCOUNTER — Ambulatory Visit (INDEPENDENT_AMBULATORY_CARE_PROVIDER_SITE_OTHER): Payer: 59 | Admitting: Sports Medicine

## 2023-05-16 VITALS — BP 125/73 | HR 75

## 2023-05-16 DIAGNOSIS — K219 Gastro-esophageal reflux disease without esophagitis: Secondary | ICD-10-CM

## 2023-05-16 MED ORDER — OMEPRAZOLE 40 MG PO CPDR: DELAYED_RELEASE_CAPSULE | ORAL | 11 refills | Status: DC

## 2023-05-16 NOTE — Progress Notes (Addendum)
    Procedures performed today:    None.  Independent interpretation of notes and tests performed by another provider:   None.  Brief History, Exam, Impression, and Recommendations:    LPRD (laryngopharyngeal reflux disease) Zitlaly returns, she is having recurrence of midepigastric discomfort with radiation to the retrosternal region as well as a burning sensation in her throat with excessive throat clearing. She experienced this before, she had a workup that was for the most part negative but never had an upper endoscopy. More recently she has started having increasing midepigastric pain with reflux symptoms. No melena, hematochezia, hematemesis. She took some over-the-counter omeprazole and has improved to some degree. We did discuss potential mitigating factors such as decreasing consumption of chocolate, tea, caffeine, spicy foods, alcohol, peppermint. We will increase her omeprazole dose to 40 mg twice daily for 2 weeks then 40 mg daily. She is also having significant dysphagia and globus sensation so I would like a modified barium swallow. If persistent symptoms we will refer her for upper endoscopy.  Update:  Modified barium swallow study and questionnaires were suggestive of laryngopharyngeal reflux as suspected.  Continue acid blockade, also work on small bites with sips of fluids in between bites, remaining upright for an hour after eating, and avoidance of any oral intake within 2 to 3 hours of bedtime.    ____________________________________________ Ihor Austin. Benjamin Stain, M.D., ABFM., CAQSM., AME. Primary Care and Sports Medicine [facility]  Adjunct Professor of Family Medicine  [place] [employer] [facility] [employer]  Restaurant manager, fast food

## 2023-05-16 NOTE — Assessment & Plan Note (Addendum)
Ana [name] returns, she is having recurrence of midepigastric discomfort with radiation to the retrosternal region as well as a burning sensation in her throat with excessive throat clearing. She experienced this before, she had a workup that was for the most part negative but never had an upper endoscopy. More recently she has started having increasing midepigastric pain with reflux symptoms. No melena, hematochezia, hematemesis. She took some over-the-counter omeprazole and has improved to some degree. We did discuss potential mitigating factors such as decreasing consumption of chocolate, tea, caffeine, spicy foods, alcohol, peppermint. We will increase her omeprazole dose to 40 mg twice daily for 2 weeks then 40 mg daily. She is also having significant dysphagia and globus sensation so I would like a modified barium swallow. If persistent symptoms we will refer her for upper endoscopy.  Update:  Modified barium swallow study and questionnaires were suggestive of laryngopharyngeal reflux as suspected.  Continue acid blockade, also work on small bites with sips of fluids in between bites, remaining upright for an hour after eating, and avoidance of any oral intake within 2 to 3 hours of bedtime.

## 2023-05-20 ENCOUNTER — Telehealth (HOSPITAL_COMMUNITY): Payer: Self-pay | Admitting: *Deleted

## 2023-05-20 NOTE — Telephone Encounter (Signed)
Attempted to contact patient to schedule OP MBS. Left VM. RKEEL 

## 2023-05-26 ENCOUNTER — Telehealth (HOSPITAL_COMMUNITY): Payer: Self-pay | Admitting: *Deleted

## 2023-05-26 NOTE — Telephone Encounter (Signed)
Attempted to contact patient to schedule OP MBS. Left VM. RKEEL 

## 2023-06-02 ENCOUNTER — Encounter: Payer: Self-pay | Admitting: Psychiatry

## 2023-06-02 ENCOUNTER — Telehealth: Payer: Self-pay | Admitting: Psychiatry

## 2023-06-02 NOTE — Telephone Encounter (Signed)
LVM and sent letter in mail informing pt of need to reschedule 09/30/23 appt - MD leaving practice

## 2023-06-03 ENCOUNTER — Telehealth (HOSPITAL_COMMUNITY): Payer: Self-pay | Admitting: *Deleted

## 2023-06-03 NOTE — Telephone Encounter (Signed)
Attempted to return patients call about scheduling OP MBS. Left VM. RKEEL

## 2023-06-05 ENCOUNTER — Other Ambulatory Visit (HOSPITAL_COMMUNITY): Payer: Self-pay | Admitting: *Deleted

## 2023-06-05 DIAGNOSIS — R1310 Dysphagia, unspecified: Secondary | ICD-10-CM

## 2023-06-08 ENCOUNTER — Other Ambulatory Visit: Payer: Self-pay | Admitting: Psychiatry

## 2023-06-08 DIAGNOSIS — G43019 Migraine without aura, intractable, without status migrainosus: Secondary | ICD-10-CM

## 2023-06-11 NOTE — Telephone Encounter (Signed)
Last seen on 01/22/23 per note "Topamax 50 mg QHS - side effects " No follow up scheduled

## 2023-06-12 ENCOUNTER — Other Ambulatory Visit: Payer: Self-pay | Admitting: Psychiatry

## 2023-06-12 DIAGNOSIS — G43019 Migraine without aura, intractable, without status migrainosus: Secondary | ICD-10-CM

## 2023-06-12 NOTE — Telephone Encounter (Signed)
Pt request refill for topiramate (TOPAMAX) 25 MG tablet sent to  [facility] [phone]     Pt has schedule appt with Maralyn Sago, NP on 09/16/23 at 2:15 pm

## 2023-06-12 NOTE — Telephone Encounter (Signed)
Requested Prescriptions   Pending Prescriptions Disp Refills   topiramate (TOPAMAX) 25 MG tablet 30 tablet 3    Sig: Take 1 tablet (25 mg total) by mouth at bedtime.  Dr. Delena Bali,  Please address if pt should continue topamax.  Thanks,  Ana [name]  Based on Dr. Quentin Mulling plan I want to make sure continue topamax is ok: Prior Therapies                                  Prevention: Topamax 50 mg QHS - side effects Gabapentin - side effects Emgality 120 mg monthly PLAN: -Prevention: Continue Emgality 120 mg monthly -Rescue: Continue naratriptan 2.5 mg PRN, Nurtec sample provided

## 2023-06-12 NOTE — Telephone Encounter (Signed)
Last seen on 01/22/23 Follow up scheduled on 09/16/23   Left detailed message on pt voicemail no mention in note that she was to continue topamax. Left message for patient to call,

## 2023-06-13 ENCOUNTER — Encounter: Payer: Self-pay | Admitting: Psychiatry

## 2023-06-17 ENCOUNTER — Other Ambulatory Visit: Payer: Self-pay

## 2023-06-17 DIAGNOSIS — G43019 Migraine without aura, intractable, without status migrainosus: Secondary | ICD-10-CM

## 2023-06-17 MED ORDER — TOPIRAMATE 25 MG PO TABS: 25.0000 mg | ORAL_TABLET | Freq: Every evening | ORAL | 3 refills | Status: DC

## 2023-06-17 MED ORDER — TOPIRAMATE 25 MG PO TABS: 25.0000 mg | ORAL_TABLET | Freq: Every day | ORAL | 3 refills | Status: DC

## 2023-06-17 NOTE — Telephone Encounter (Signed)
Looks like we had discussed weaning Topamax previously, and I didn't realize she had continued it after that visit. We can continue it for now and she can decide whether to wean it at her next appt

## 2023-06-18 ENCOUNTER — Ambulatory Visit (HOSPITAL_COMMUNITY)
Admission: RE | Admit: 2023-06-18 | Discharge: 2023-06-18 | Disposition: A | Discharge status: Home / Self Care | Payer: 59 | Source: Ambulatory Visit | Attending: Sports Medicine | Admitting: Sports Medicine

## 2023-06-18 DIAGNOSIS — K219 Gastro-esophageal reflux disease without esophagitis: Secondary | ICD-10-CM | POA: Insufficient documentation

## 2023-06-18 DIAGNOSIS — R1310 Dysphagia, unspecified: Secondary | ICD-10-CM

## 2023-06-27 ENCOUNTER — Ambulatory Visit (INDEPENDENT_AMBULATORY_CARE_PROVIDER_SITE_OTHER): Payer: 59 | Admitting: Sports Medicine

## 2023-06-27 ENCOUNTER — Encounter: Payer: Self-pay | Admitting: Sports Medicine

## 2023-06-27 VITALS — BP 104/67 | HR 72 | Ht 64.0 in | Wt 135.0 lb

## 2023-06-27 DIAGNOSIS — K219 Gastro-esophageal reflux disease without esophagitis: Secondary | ICD-10-CM

## 2023-06-27 DIAGNOSIS — R635 Abnormal weight gain: Secondary | ICD-10-CM

## 2023-06-27 NOTE — Assessment & Plan Note (Signed)
Ana [name] returns, she has been struggling to some degree with her weight as she has past menopause. She is within a normal body mass index range, but she has gained about 10 or 20 pounds. She finds that it is more difficult now to keep the weight off and maintain lean muscle. We discussed the modalities including increasing resistance training, and cutting back carbohydrates as best as she can. She will try this for about 6 weeks, we will check her weight and if she does not get to goal we could certainly try a short course of low-dose phentermine. She did phentermine back in 2021 and did well.

## 2023-06-27 NOTE — Assessment & Plan Note (Signed)
Ana [name] returns, we have been managing her for recurrent midepigastric discomfort with radiation to the retrosternal region as well as a burning sensation in her throat with excessive throat clearing. Ultimately we increased her omeprazole to 40 mg twice a day, we also got a modified barium swallow, the findings and questionnaires were suggestive of laryngeal pharyngeal reflux. We continued acid blockade and recommended small bites with sips of fluid in between, remaining upright after eating, avoidance of any oral intake within 2 to 3 hours of bedtime and minimizing alcohol, peppermint, coffee, and tea. She is doing a lot better today. If she does have a recurrence or flare and discomfort I think it is reasonable to go and get her set up for an upper endoscopy.

## 2023-06-27 NOTE — Progress Notes (Signed)
    Procedures performed today:    None.  Independent interpretation of notes and tests performed by another provider:   None.  Brief History, Exam, Impression, and Recommendations:    LPRD (laryngopharyngeal reflux disease) Ana [name] returns, we have been managing her for recurrent midepigastric discomfort with radiation to the retrosternal region as well as a burning sensation in her throat with excessive throat clearing. Ultimately we increased her omeprazole to 40 mg twice a day, we also got a modified barium swallow, the findings and questionnaires were suggestive of laryngeal pharyngeal reflux. We continued acid blockade and recommended small bites with sips of fluid in between, remaining upright after eating, avoidance of any oral intake within 2 to 3 hours of bedtime and minimizing alcohol, peppermint, coffee, and tea. She is doing a lot better today. If she does have a recurrence or flare and discomfort I think it is reasonable to go and get her set up for an upper endoscopy.  Abnormal weight gain [name] returns, she has been struggling to some degree with her weight as she has past menopause. She is within a normal body mass index range, but she has gained about 10 or 20 pounds. She finds that it is more difficult now to keep the weight off and maintain lean muscle. We discussed the modalities including increasing resistance training, and cutting back carbohydrates as best as she can. She will try this for about 6 weeks, we will check her weight and if she does not get to goal we could certainly try a short course of low-dose phentermine. She did phentermine back in 2021 and did well.  I spent 30 minutes of total time managing this patient today, this includes chart review, face to face, and non-face to face time.  ____________________________________________ Ihor Austin. Benjamin Stain, M.D., ABFM., CAQSM., AME. Primary Care and Sports Medicine [facility]  [facility]  Adjunct Professor of Family Medicine  [place] [employer] [facility] [employer]  Restaurant manager, fast food

## 2023-08-08 ENCOUNTER — Encounter: Payer: Self-pay | Admitting: Sports Medicine

## 2023-08-08 ENCOUNTER — Ambulatory Visit (INDEPENDENT_AMBULATORY_CARE_PROVIDER_SITE_OTHER): Payer: 59 | Admitting: Sports Medicine

## 2023-08-08 ENCOUNTER — Ambulatory Visit: Payer: 59

## 2023-08-08 VITALS — BP 125/84 | HR 79 | Ht 64.0 in | Wt 136.0 lb

## 2023-08-08 DIAGNOSIS — R0789 Other chest pain: Secondary | ICD-10-CM | POA: Diagnosis not present

## 2023-08-08 DIAGNOSIS — R0781 Pleurodynia: Secondary | ICD-10-CM | POA: Diagnosis not present

## 2023-08-08 DIAGNOSIS — M542 Cervicalgia: Secondary | ICD-10-CM | POA: Diagnosis not present

## 2023-08-08 DIAGNOSIS — R635 Abnormal weight gain: Secondary | ICD-10-CM

## 2023-08-08 LAB — POCT URINALYSIS DIP (CLINITEK)
Bilirubin, UA: NEGATIVE
Blood, UA: NEGATIVE
Glucose, UA: NEGATIVE mg/dL
Leukocytes, UA: NEGATIVE
Nitrite, UA: NEGATIVE
POC PROTEIN,UA: NEGATIVE
Spec Grav, UA: 1.025 (ref 1.010–1.025)
Urobilinogen, UA: 0.2 U/dL
pH, UA: 6 (ref 5.0–8.0)

## 2023-08-08 MED ORDER — PHENTERMINE HCL 37.5 MG PO TABS: 18.7500 mg | ORAL_TABLET | Freq: Every day | ORAL | 0 refills | Status: DC

## 2023-08-08 MED ORDER — PREDNISONE 50 MG PO TABS: ORAL_TABLET | ORAL | 0 refills | Status: DC

## 2023-08-08 MED ORDER — METHOCARBAMOL 500 MG PO TABS
500.0000 mg | ORAL_TABLET | Freq: Three times a day (TID) | ORAL | 0 refills | Status: DC
Start: 2023-08-08 — End: 2024-02-02

## 2023-08-08 MED ORDER — DIAZEPAM 5 MG PO TABS: 5.0000 mg | ORAL_TABLET | Freq: Three times a day (TID) | ORAL | 0 refills | Status: DC | PRN

## 2023-08-08 NOTE — Progress Notes (Signed)
    Procedures performed today:    None.  Independent interpretation of notes and tests performed by another provider:   None.  Brief History, Exam, Impression, and Recommendations:    Pleuritic chest pain This is a pleasant 52 year old female, she had about 2 weeks of pleuritic chest pain localized left lower chest from posterior radiating straight through to the left lower anterior chest and left upper quadrant abdomen. She did have a viral illness about 2 weeks ago. Pain is moderately pleuritic. On exam she does have tenderness to palpation along the parathoracic musculature, she also has tenderness to palpation of the left costovertebral angle. There are no visible rashes over the skin. Differential is broad, I do suspect this is more related to a cervical radicular cause as the pain does radiate into the left periscapular region but in the light of the recent viral infection and now with pleuritic chest pain we do need to rule out thromboembolism, we also need to rule out nephrolithiasis. We will get a urinalysis-->UA was negative today, CBC, CMP, lipase, D-dimer. In addition we will do cervical spine and thoracic spine x-rays, 5 days of prednisone, methocarbamol and Valium. Unfortunately she does have a trip across the country tomorrow.  Update: D-dimer was elevated, I have been communicating with the patient through the weekend, she is feeling okay, no shortness of breath, however due to the pleuritic chest pain and now elevated D-dimer she does understand she will need a CT angio of the pulmonary arteries when she gets back.  Abnormal weight gain Adhithi has also been struggling to some degree with her weight as she is going through menopause, she has a normal BMI, she did however gain 10 to 20 pounds, we discussed multiple modalities including resistance training, cutting carbs, she got sick along the way and then developed an injury. She is about the same weight, she is interested  in trying a low-dose phentermine, she did well in 2021, will do a half tab but she understands not to start until we get the below processes under control.    ____________________________________________ Ihor Austin. Benjamin Stain, M.D., ABFM., CAQSM., AME. Primary Care and Sports Medicine [facility]  Adjunct Professor of Family Medicine  [place] [employer] [facility] [employer]  Restaurant manager, fast food

## 2023-08-08 NOTE — Assessment & Plan Note (Addendum)
This is a pleasant 52 year old female, she had about 2 weeks of pleuritic chest pain localized left lower chest from posterior radiating straight through to the left lower anterior chest and left upper quadrant abdomen. She did have a viral illness about 2 weeks ago. Pain is moderately pleuritic. On exam she does have tenderness to palpation along the parathoracic musculature, she also has tenderness to palpation of the left costovertebral angle. There are no visible rashes over the skin. Differential is broad, I do suspect this is more related to a cervical radicular cause as the pain does radiate into the left periscapular region but in the light of the recent viral infection and now with pleuritic chest pain we do need to rule out thromboembolism, we also need to rule out nephrolithiasis. We will get a urinalysis-->UA was negative today, CBC, CMP, lipase, D-dimer. In addition we will do cervical spine and thoracic spine x-rays, 5 days of prednisone, methocarbamol and Valium. Unfortunately she does have a trip across the country tomorrow.  Update: D-dimer was elevated, I have been communicating with the patient through the weekend, she is feeling okay, no shortness of breath, however due to the pleuritic chest pain and now elevated D-dimer she does understand she will need a CT angio of the pulmonary arteries when she gets back.

## 2023-08-08 NOTE — Assessment & Plan Note (Signed)
Chie has also been struggling to some degree with her weight as she is going through menopause, she has a normal BMI, she did however gain 10 to 20 pounds, we discussed multiple modalities including resistance training, cutting carbs, she got sick along the way and then developed an injury. She is about the same weight, she is interested in trying a low-dose phentermine, she did well in 2021, will do a half tab but she understands not to start until we get the below processes under control.

## 2023-08-09 ENCOUNTER — Encounter: Payer: Self-pay | Admitting: Sports Medicine

## 2023-08-09 LAB — CBC
Hematocrit: 42.2 % (ref 34.0–46.6)
Hemoglobin: 14.1 g/dL (ref 11.1–15.9)
MCH: 28.8 pg (ref 26.6–33.0)
MCHC: 33.4 g/dL (ref 31.5–35.7)
MCV: 86 fL (ref 79–97)
Platelets: 286 10*3/uL (ref 150–450)
RBC: 4.9 x10E6/uL (ref 3.77–5.28)
RDW: 12.1 % (ref 11.7–15.4)
WBC: 6.3 10*3/uL (ref 3.4–10.8)

## 2023-08-09 LAB — COMPREHENSIVE METABOLIC PANEL
ALT: 17 [IU]/L (ref 0–32)
AST: 19 [IU]/L (ref 0–40)
Albumin: 4.7 g/dL (ref 3.9–4.9)
Alkaline Phosphatase: 88 [IU]/L (ref 44–121)
BUN/Creatinine Ratio: 19 (ref 9–23)
BUN: 17 mg/dL (ref 6–24)
Bilirubin Total: 0.4 mg/dL (ref 0.0–1.2)
CO2: 22 mmol/L (ref 20–29)
Calcium: 9.7 mg/dL (ref 8.7–10.2)
Chloride: 100 mmol/L (ref 96–106)
Creatinine, Ser: 0.88 mg/dL (ref 0.57–1.00)
Globulin, Total: 3 g/dL (ref 1.5–4.5)
Glucose: 83 mg/dL (ref 70–99)
Potassium: 4 mmol/L (ref 3.5–5.2)
Sodium: 140 mmol/L (ref 134–144)
Total Protein: 7.7 g/dL (ref 6.0–8.5)
eGFR: 80 mL/min/{1.73_m2} (ref >59)

## 2023-08-09 LAB — LIPASE: Lipase: 33 U/L (ref 14–72)

## 2023-08-09 LAB — D-DIMER, QUANTITATIVE: D-DIMER: 1.03 mg{FEU}/L — ABNORMAL HIGH (ref 0.00–0.49)

## 2023-08-11 NOTE — Addendum Note (Signed)
Addended by: Monica Becton on: 08/11/2023 08:34 AM   Modules accepted: Orders

## 2023-08-13 ENCOUNTER — Ambulatory Visit (INDEPENDENT_AMBULATORY_CARE_PROVIDER_SITE_OTHER): Payer: BC Managed Care – PPO

## 2023-08-13 DIAGNOSIS — R0781 Pleurodynia: Secondary | ICD-10-CM | POA: Diagnosis not present

## 2023-08-13 MED ORDER — IOHEXOL 350 MG/ML SOLN
100.0000 mL | Freq: Once | INTRAVENOUS | Status: AC | PRN
  Administered 2023-08-13: 100 mL via INTRAVENOUS

## 2023-08-28 NOTE — Telephone Encounter (Signed)
Yes please change to stat

## 2023-08-28 NOTE — Telephone Encounter (Signed)
I called [facility].

## 2023-08-29 ENCOUNTER — Encounter: Payer: Self-pay | Admitting: Sports Medicine

## 2023-08-29 ENCOUNTER — Ambulatory Visit (INDEPENDENT_AMBULATORY_CARE_PROVIDER_SITE_OTHER): Payer: 59 | Admitting: Sports Medicine

## 2023-08-29 ENCOUNTER — Ambulatory Visit: Payer: 59

## 2023-08-29 VITALS — BP 120/79 | HR 92 | Ht 64.0 in | Wt 134.0 lb

## 2023-08-29 DIAGNOSIS — M5030 Other cervical disc degeneration, unspecified cervical region: Secondary | ICD-10-CM | POA: Diagnosis not present

## 2023-08-29 DIAGNOSIS — M5416 Radiculopathy, lumbar region: Secondary | ICD-10-CM | POA: Diagnosis not present

## 2023-08-29 DIAGNOSIS — S73191S Other sprain of right hip, sequela: Secondary | ICD-10-CM

## 2023-08-29 DIAGNOSIS — M542 Cervicalgia: Secondary | ICD-10-CM

## 2023-08-29 MED ORDER — GABAPENTIN 300 MG PO CAPS: ORAL_CAPSULE | ORAL | 3 refills | Status: DC

## 2023-08-29 NOTE — Assessment & Plan Note (Signed)
Lumbar DDD, failed over 6 weeks of conservative treatment, adding gabapentin, MRI for epidural planning, suspected left L5 nerve root impingement.

## 2023-08-29 NOTE — Assessment & Plan Note (Signed)
Persistent pain left shoulder, left periscapular, has failed over 6 weeks of physical therapy, medication, proceeding with MRI for epidural planning, adding some gabapentin as well.

## 2023-08-29 NOTE — Progress Notes (Signed)
    Procedures performed today:    None.  Independent interpretation of notes and tests performed by another provider:   None.  Brief History, Exam, Impression, and Recommendations:    DDD (degenerative disc disease), cervical Persistent pain left shoulder, left periscapular, has failed over 6 weeks of physical therapy, medication, proceeding with MRI for epidural planning, adding some gabapentin as well.  Left lumbar radiculopathy Lumbar DDD, failed over 6 weeks of conservative treatment, adding gabapentin, MRI for epidural planning, suspected left L5 nerve root impingement.    ____________________________________________ Ihor Austin. Benjamin Stain, M.D., ABFM., CAQSM., AME. Primary Care and Sports Medicine [facility]  Adjunct Professor of Family Medicine  [place] [employer] [facility] [employer]  Restaurant manager, fast food

## 2023-09-04 ENCOUNTER — Telehealth: Payer: Self-pay | Admitting: Sports Medicine

## 2023-09-04 NOTE — Telephone Encounter (Signed)
Task was completed earlier today for double study. The imaging center has been notified to contact the patient for scheduling. Thanks.

## 2023-09-04 NOTE — Telephone Encounter (Signed)
Patient states that she needs a PA for MRI

## 2023-09-07 ENCOUNTER — Ambulatory Visit: Payer: 59

## 2023-09-07 DIAGNOSIS — M5416 Radiculopathy, lumbar region: Secondary | ICD-10-CM | POA: Diagnosis not present

## 2023-09-07 DIAGNOSIS — M5030 Other cervical disc degeneration, unspecified cervical region: Secondary | ICD-10-CM

## 2023-09-07 DIAGNOSIS — G8929 Other chronic pain: Secondary | ICD-10-CM | POA: Diagnosis not present

## 2023-09-07 DIAGNOSIS — M5412 Radiculopathy, cervical region: Secondary | ICD-10-CM | POA: Diagnosis not present

## 2023-09-07 DIAGNOSIS — M542 Cervicalgia: Secondary | ICD-10-CM

## 2023-09-16 ENCOUNTER — Ambulatory Visit (INDEPENDENT_AMBULATORY_CARE_PROVIDER_SITE_OTHER): Payer: 59 | Admitting: Sports Medicine

## 2023-09-16 ENCOUNTER — Encounter: Payer: Self-pay | Admitting: Sports Medicine

## 2023-09-16 ENCOUNTER — Encounter: Payer: Self-pay | Admitting: Neurology

## 2023-09-16 ENCOUNTER — Ambulatory Visit (INDEPENDENT_AMBULATORY_CARE_PROVIDER_SITE_OTHER): Payer: BC Managed Care – PPO | Admitting: Neurology

## 2023-09-16 DIAGNOSIS — G43019 Migraine without aura, intractable, without status migrainosus: Secondary | ICD-10-CM

## 2023-09-16 DIAGNOSIS — S73191S Other sprain of right hip, sequela: Secondary | ICD-10-CM

## 2023-09-16 DIAGNOSIS — M5030 Other cervical disc degeneration, unspecified cervical region: Secondary | ICD-10-CM | POA: Diagnosis not present

## 2023-09-16 DIAGNOSIS — M5416 Radiculopathy, lumbar region: Secondary | ICD-10-CM | POA: Diagnosis not present

## 2023-09-16 MED ORDER — TOPIRAMATE 25 MG PO TABS: 25.0000 mg | ORAL_TABLET | Freq: Every day | ORAL | 3 refills | Status: DC

## 2023-09-16 MED ORDER — NARATRIPTAN HCL 2.5 MG PO TABS: 2.5000 mg | ORAL_TABLET | Freq: Two times a day (BID) | ORAL | 11 refills | Status: DC | PRN

## 2023-09-16 MED ORDER — EMGALITY 120 MG/ML ~~LOC~~ SOAJ: 120.0000 mg | SUBCUTANEOUS | 11 refills | Status: DC

## 2023-09-16 NOTE — Assessment & Plan Note (Signed)
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 She is currently thinking more on the lines of hip replacement rather than labral debridement/repair. I think this is a reasonable approach. She is going to delay her appointment with the hip surgeon until we can get her back under better control.

## 2023-09-16 NOTE — Progress Notes (Signed)
Patient: Ana [name] Date of Birth: 1971-08-30  Reason for Visit: Follow up History from: Patient Primary Neurologist: [facility]   ASSESSMENT AND PLAN 52 y.o. year old female   1.  Chronic migraine headache  -Under very good control -Continue Emgality for migraine prevention -Continue Topamax 25 mg at bedtime for migraine prevention, may try to wean off this -Continue Amerge 2.5 mg as needed for acute migraine -Has Nurtec sample she may try -Follow-up in 1 year virtually or sooner if needed  HISTORY OF PRESENT ILLNESS: Today 09/16/23 Right now doing great. Migraines worsen in spring/summer. In the last month, no more than 2 migraines. Remains on Emgality. Takes Amerge PRN. Migraines are usually several days back to back. May start with motrin. Amerge works well, was given samples Nurtec, hasn't tried it yet. Remains on Topamax 25 mg at bedtime. Had stopped it, restarted it during summer months. 1-2 times a year will need a prednisone taper. Having some back issues.   HISTORY  01/22/23 Dr. Delena Bali: Brief HPI: 52 year old female who follows in clinic for chronic migraines and right-sided headaches with ipsilateral autonomic features. MRI brain 02/27/22 was normal.    At her last visit she was continued on Emgality for migraine prevention and naratriptan for rescue   Interval History: Headaches are about the same since her last visit. She did miss a couple of doses of Emgality due to insurance and pharmacy supply issues. Naratriptan continues to help for rescue, though she will occasionally get migraines that last for 1-2 weeks which do not respond to naratriptan.   Migraine days per month: 5 Headache free days per month: 25   Current Headache Regimen: Preventative: Emgality 120 mg monthly Abortive: naratriptan 2.5 mg PRN   Prior Therapies                                  Prevention: Topamax 50 mg QHS - side effects Gabapentin - side effects Emgality 120 mg monthly    Rescue: Naratriptan 2.5 mg PRN Maxalt 10 mg PRN  REVIEW OF SYSTEMS: Out of a complete 14 system review of symptoms, the patient complains only of the following symptoms, and all other reviewed systems are negative.  See HPI  ALLERGIES: Allergies  Allergen Reactions   Amitriptyline Nausea And Vomiting   Augmentin [Amoxicillin-Pot Clavulanate]     Vomiting/diarrhea     Omnicef [Cefdinir] Other (See Comments)    GI upset   Trazodone And Nefazodone Nausea And Vomiting    HOME MEDICATIONS: Outpatient Medications Prior to Visit  Medication Sig Dispense Refill   estradiol (ESTRACE) 2 MG tablet estradiol 2 mg tablet  TAKE 1 TABLET BY MOUTH EVERY DAY AS DIRECTED     gabapentin (NEURONTIN) 300 MG capsule One tab PO qHS for a week, then BID for a week, then TID. May double weekly to a max of 3,600mg /day 90 capsule 3   Galcanezumab-gnlm (EMGALITY) 120 MG/ML SOAJ Inject 120 mg into the skin every 30 (thirty) days. 1 mL 11   methocarbamol (ROBAXIN) 500 MG tablet Take 1 tablet (500 mg total) by mouth 3 (three) times daily. 90 tablet 0   naratriptan (AMERGE) 2.5 MG tablet Take 1 tablet (2.5 mg total) by mouth 2 (two) times daily as needed. 10 tablet 6   omeprazole (PRILOSEC) 40 MG capsule 1 capsule p.o. twice a day for 2 weeks and then 1 capsule p.o. daily 60 capsule 11  phentermine (ADIPEX-P) 37.5 MG tablet Take 0.5 tablets (18.75 mg total) by mouth daily before breakfast. 45 tablet 0   topiramate (TOPAMAX) 25 MG tablet Take 1 tablet (25 mg total) by mouth at bedtime. 30 tablet 3   zolpidem (AMBIEN) 10 MG tablet TAKE 1 TABLET BY MOUTH EVERY DAY AT NIGHT 90 tablet 2   diazepam (VALIUM) 5 MG tablet Take 1 tablet (5 mg total) by mouth every 8 (eight) hours as needed. 30 tablet 0   predniSONE (DELTASONE) 50 MG tablet One tab PO daily for 5 days. 5 tablet 0   Rimegepant Sulfate (NURTEC) 75 MG TBDP Take 1 tablet (75 mg total) by mouth as needed. 2 tablet 0   topiramate (TOPAMAX) 25 MG tablet Take 1  tablet (25 mg total) by mouth at bedtime. 30 tablet 3   No facility-administered medications prior to visit.    PAST MEDICAL HISTORY: Past Medical History:  Diagnosis Date   Asthma    intermittent   Borderline hyperlipidemia    No med trials in the past   Common migraine with intractable migraine 11/14/2015   Fibroids    GERD (gastroesophageal reflux disease)    controlled with diet   Headache 08/23/2015   Migraine headache    about 2 per yr   Seasonal allergies     PAST SURGICAL HISTORY: Past Surgical History:  Procedure Laterality Date   ABDOMINAL HYSTERECTOMY  08/31/2012   Procedure: HYSTERECTOMY ABDOMINAL;  Surgeon: Turner Daniels, MD;  Location: [facility];  Service: Gynecology;  Laterality: N/A;   CESAREAN SECTION  2005, 2007   x2   DILATION AND CURETTAGE OF UTERUS     missed ab   KNEE ARTHROSCOPY     MYOMECTOMY     TONSILLECTOMY     WISDOM TOOTH EXTRACTION      FAMILY HISTORY: Family History  Problem Relation Age of Onset   Arthritis Mother    Hypertension Mother    Hyperlipidemia Father    Hypertension Father    Cancer Paternal Grandmother        breast   Arthritis Maternal Grandmother    Heart disease Paternal Grandfather    Migraines Neg Hx     SOCIAL HISTORY: Social History   Socioeconomic History   Marital status: Married    Spouse name: Air cabin crew   Number of children: 2   Years of education: College   Highest education level: Not on file  Occupational History    Employer: [employer]  Tobacco Use   Smoking status: Never   Smokeless tobacco: Never  Vaping Use   Vaping status: Never Used  Substance and Sexual Activity   Alcohol use: No    Alcohol/week: 0.0 standard drinks of alcohol    Comment: social 1 glass of wine monthly   Drug use: No   Sexual activity: Not on file  Other Topics Concern   Not on file  Social History Narrative   Married.  Two kids.   College grad--.   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   No  T/A/Ds.   Caffeine Use: 1-2 cup daily   Patient is right handed.       Social Determinants of Health   Financial Resource Strain: Not on file  Food Insecurity: Not on file  Transportation Needs: Not on file  Physical Activity: Not on file  Stress: Not on file  Social Connections: Not on file  Intimate Partner Violence: Not on file    PHYSICAL EXAM  Vitals:   09/16/23 1403  BP: 121/75  Pulse: 88  Weight: 136 lb 6.4 oz (61.9 kg)  Height: 5\' 4"  (1.626 m)   Body mass index is 23.41 kg/m.  Generalized: Well developed, in no acute distress  Neurological examination  Mentation: Alert oriented to time, place, history taking. Follows all commands speech and language fluent Cranial nerve II-XII: Pupils were equal round reactive to light. Extraocular movements were full, visual field were full on confrontational test. Facial sensation and strength were normal. Head turning and shoulder shrug  were normal and symmetric. Motor: The motor testing reveals 5 over 5 strength of all 4 extremities. Good symmetric motor tone is noted throughout.  Sensory: Sensory testing is intact to soft touch on all 4 extremities. No evidence of extinction is noted.  Coordination: Cerebellar testing reveals good finger-nose-finger and heel-to-shin bilaterally.  Gait and station: Gait is normal.   DIAGNOSTIC DATA (LABS, IMAGING, TESTING) - I reviewed patient records, labs, notes, testing and imaging myself where available.  Lab Results  Component Value Date   WBC 6.3 08/08/2023   HGB 14.1 08/08/2023   HCT 42.2 08/08/2023   MCV 86 08/08/2023   PLT 286 08/08/2023      Component Value Date/Time   NA 140 08/08/2023 1635   K 4.0 08/08/2023 1635   CL 100 08/08/2023 1635   CO2 22 08/08/2023 1635   GLUCOSE 83 08/08/2023 1635   GLUCOSE 81 10/31/2022 1151   BUN 17 08/08/2023 1635   CREATININE 0.88 08/08/2023 1635   CREATININE 0.92 10/31/2022 1151   CALCIUM 9.7 08/08/2023 1635   PROT 7.7 08/08/2023 1635    ALBUMIN 4.7 08/08/2023 1635   AST 19 08/08/2023 1635   ALT 17 08/08/2023 1635   ALKPHOS 88 08/08/2023 1635   BILITOT 0.4 08/08/2023 1635   GFRNONAA 74 01/10/2021 0000   GFRAA 86 01/10/2021 0000   Lab Results  Component Value Date   CHOL 246 (H) 10/31/2022   HDL 101 10/31/2022   LDLCALC 128 (H) 10/31/2022   TRIG 80 10/31/2022   CHOLHDL 2.4 10/31/2022   Lab Results  Component Value Date   HGBA1C 5.1 10/31/2022   No results found for: "VITAMINB12" Lab Results  Component Value Date   TSH 0.97 10/31/2022    Margie Ege, AGNP-C, DNP 09/16/2023, 2:13 PM [facility] [address] [place], [place] [address] [phone]

## 2023-09-16 NOTE — Patient Instructions (Signed)
I am glad you are doing well!  We will continue current medications.  Please let me know if your headaches increase.  I will see you back in 1 year virtually.  Thanks!!

## 2023-09-16 NOTE — Progress Notes (Signed)
    Procedures performed today:    None.  Independent interpretation of notes and tests performed by another provider:   I did personally review the MRIs with the patient, multilevel cervical DDD with central canal stenosis, there is also lower lumbar facet arthritis with foraminal stenosis L4-L5.  Brief History, Exam, Impression, and Recommendations:    DDD (degenerative disc disease), cervical Known cervical DDD confirmed on MRI, increasing pain and spite of gabapentin, over 6 weeks of home physical therapy. Proceeding with cervical epidural.  Left lumbar radiculopathy Also with known lumbar DDD and facet arthropathy, MRI confirms the above, she has failed 6 weeks of conservative treatment and gabapentin. We went over all of her imaging together as well as the anatomy of the spine. She does have foraminal narrowing L4-L5 mostly due to facet arthropathy. Proceeding with left L4-L5 interlaminar epidural. If this fails we will try left L3-S1 facet joint injections.  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 She is currently thinking more on the lines of hip replacement rather than labral debridement/repair. I think this is a reasonable approach. She is going to delay her appointment with the hip surgeon until we can get her back under better control.    ____________________________________________ Ihor Austin. Benjamin Stain, M.D., ABFM., CAQSM., AME. Primary Care and Sports Medicine [facility]  Adjunct Professor of Family Medicine  [place] [employer] [facility] [employer]  Restaurant manager, fast food

## 2023-09-16 NOTE — Assessment & Plan Note (Signed)
Known cervical DDD confirmed on MRI, increasing pain and spite of gabapentin, over 6 weeks of home physical therapy. Proceeding with cervical epidural.

## 2023-09-16 NOTE — Assessment & Plan Note (Signed)
Also with known lumbar DDD and facet arthropathy, MRI confirms the above, she has failed 6 weeks of conservative treatment and gabapentin. We went over all of her imaging together as well as the anatomy of the spine. She does have foraminal narrowing L4-L5 mostly due to facet arthropathy. Proceeding with left L4-L5 interlaminar epidural. If this fails we will try left L3-S1 facet joint injections.

## 2023-09-19 ENCOUNTER — Ambulatory Visit: Payer: 59 | Admitting: Sports Medicine

## 2023-09-24 ENCOUNTER — Encounter: Payer: Self-pay | Admitting: Sports Medicine

## 2023-09-25 ENCOUNTER — Ambulatory Visit (HOSPITAL_BASED_OUTPATIENT_CLINIC_OR_DEPARTMENT_OTHER): Payer: 59 | Admitting: Orthopaedic Surgery

## 2023-09-29 NOTE — Discharge Instructions (Signed)

## 2023-09-30 ENCOUNTER — Ambulatory Visit: Payer: 59 | Admitting: Psychiatry

## 2023-09-30 ENCOUNTER — Ambulatory Visit
Admission: RE | Admit: 2023-09-30 | Discharge: 2023-09-30 | Disposition: A | Discharge status: Home / Self Care | Payer: 59 | Source: Ambulatory Visit | Attending: Sports Medicine | Admitting: Sports Medicine

## 2023-09-30 DIAGNOSIS — M5416 Radiculopathy, lumbar region: Secondary | ICD-10-CM

## 2023-09-30 MED ORDER — METHYLPREDNISOLONE ACETATE 40 MG/ML INJ SUSP (RADIOLOG
80.0000 mg | Freq: Once | INTRAMUSCULAR | Status: AC
  Administered 2023-09-30: 80 mg via EPIDURAL

## 2023-09-30 MED ORDER — IOPAMIDOL (ISOVUE-M 200) INJECTION 41%
1.0000 mL | Freq: Once | INTRAMUSCULAR | Status: AC
  Administered 2023-09-30: 1 mL via EPIDURAL

## 2023-10-16 ENCOUNTER — Other Ambulatory Visit: Payer: 59

## 2023-10-28 ENCOUNTER — Encounter: Payer: Self-pay | Admitting: Sports Medicine

## 2023-10-31 NOTE — Discharge Instructions (Signed)

## 2023-11-04 ENCOUNTER — Ambulatory Visit
Admission: RE | Admit: 2023-11-04 | Discharge: 2023-11-04 | Disposition: A | Discharge status: Home / Self Care | Payer: 59 | Source: Ambulatory Visit | Attending: Sports Medicine | Admitting: Sports Medicine

## 2023-11-04 DIAGNOSIS — M5030 Other cervical disc degeneration, unspecified cervical region: Secondary | ICD-10-CM

## 2023-11-04 MED ORDER — IOPAMIDOL (ISOVUE-M 300) INJECTION 61%
1.0000 mL | Freq: Once | INTRAMUSCULAR | Status: AC | PRN
Start: 2023-11-04 — End: 2023-11-04
  Administered 2023-11-04: 1 mL via EPIDURAL

## 2023-11-04 MED ORDER — TRIAMCINOLONE ACETONIDE 40 MG/ML IJ SUSP (RADIOLOGY)
60.0000 mg | Freq: Once | INTRAMUSCULAR | Status: AC
  Administered 2023-11-04: 60 mg via EPIDURAL

## 2023-11-05 ENCOUNTER — Ambulatory Visit (HOSPITAL_BASED_OUTPATIENT_CLINIC_OR_DEPARTMENT_OTHER): Payer: 59 | Admitting: Orthopaedic Surgery

## 2023-11-06 ENCOUNTER — Other Ambulatory Visit: Payer: Self-pay | Admitting: Sports Medicine

## 2023-11-06 DIAGNOSIS — R635 Abnormal weight gain: Secondary | ICD-10-CM

## 2023-12-05 ENCOUNTER — Ambulatory Visit (INDEPENDENT_AMBULATORY_CARE_PROVIDER_SITE_OTHER): Payer: 59 | Admitting: Sports Medicine

## 2023-12-05 ENCOUNTER — Encounter: Payer: Self-pay | Admitting: Sports Medicine

## 2023-12-05 DIAGNOSIS — M5030 Other cervical disc degeneration, unspecified cervical region: Secondary | ICD-10-CM | POA: Diagnosis not present

## 2023-12-05 DIAGNOSIS — M5416 Radiculopathy, lumbar region: Secondary | ICD-10-CM | POA: Diagnosis not present

## 2023-12-05 NOTE — Assessment & Plan Note (Signed)
This is a very pleasant 53 year old female with known cervical DDD confirmed on MRI, she did have increasing pain in spite of gabapentin, 6 weeks of home physical therapy, we did proceed with cervical epidural, she was actually doing pretty well by the time she got the injection, she is doing really well now, return as needed for this.

## 2023-12-05 NOTE — Progress Notes (Signed)
    Procedures performed today:    None.  Independent interpretation of notes and tests performed by another provider:   None.  Brief History, Exam, Impression, and Recommendations:    DDD (degenerative disc disease), cervical This is a very pleasant 53 year old female with known cervical DDD confirmed on MRI, she did have increasing pain in spite of gabapentin, 6 weeks of home physical therapy, we did proceed with cervical epidural, she was actually doing pretty well by the time she got the injection, she is doing really well now, return as needed for this.  Left lumbar radiculopathy Also with known lumbar DDD, facet arthropathy, she failed conservative treatment so we proceeded with a left-sided L4-L5 interlaminar epidural, she reported relief starting at 4 to 7 days with a crescendo of relief at 2 to 4 weeks, she was almost completely pain-free. Angelly also noted that her left knee pain and left quadratus lumborum pain improved after the lumbar epidural. She has had a slight recurrence of discomfort but also she did discontinue her gabapentin for a period of time. She has restarted gabapentin and is doing a lot better. No additional intervention needed though if subsequent lumbar epidurals fail we would certainly try the left L3-S1 facet joints.    ____________________________________________ Ihor Austin. Benjamin Stain, M.D., ABFM., CAQSM., AME. Primary Care and Sports Medicine [facility]  Adjunct Professor of Family Medicine  [place] [employer] [facility] [employer]  Restaurant manager, fast food

## 2023-12-05 NOTE — Assessment & Plan Note (Addendum)
Also with known lumbar DDD, facet arthropathy, she failed conservative treatment so we proceeded with a left-sided L4-L5 interlaminar epidural, she reported relief starting at 4 to 7 days with a crescendo of relief at 2 to 4 weeks, she was almost completely pain-free. Ana [name] also noted that her left knee pain and left quadratus lumborum pain improved after the lumbar epidural. She has had a slight recurrence of discomfort but also she did discontinue her gabapentin for a period of time. She has restarted gabapentin and is doing a lot better. No additional intervention needed though if subsequent lumbar epidurals fail we would certainly try the left L3-S1 facet joints.

## 2023-12-17 ENCOUNTER — Encounter (INDEPENDENT_AMBULATORY_CARE_PROVIDER_SITE_OTHER): Payer: Self-pay | Admitting: Sports Medicine

## 2023-12-17 DIAGNOSIS — M5416 Radiculopathy, lumbar region: Secondary | ICD-10-CM | POA: Diagnosis not present

## 2023-12-18 NOTE — Telephone Encounter (Signed)

## 2023-12-19 ENCOUNTER — Encounter: Payer: Self-pay | Admitting: Sports Medicine

## 2023-12-24 NOTE — Discharge Instructions (Signed)

## 2023-12-25 ENCOUNTER — Ambulatory Visit
Admission: RE | Admit: 2023-12-25 | Discharge: 2023-12-25 | Disposition: A | Discharge status: Home / Self Care | Source: Ambulatory Visit | Attending: Sports Medicine | Admitting: Sports Medicine

## 2023-12-25 DIAGNOSIS — M5416 Radiculopathy, lumbar region: Secondary | ICD-10-CM

## 2023-12-25 MED ORDER — IOPAMIDOL (ISOVUE-M 200) INJECTION 41%
1.0000 mL | Freq: Once | INTRAMUSCULAR | Status: AC
  Administered 2023-12-25: 1 mL via EPIDURAL

## 2023-12-25 MED ORDER — METHYLPREDNISOLONE ACETATE 40 MG/ML INJ SUSP (RADIOLOG
80.0000 mg | Freq: Once | INTRAMUSCULAR | Status: AC
  Administered 2023-12-25: 80 mg via EPIDURAL

## 2023-12-29 ENCOUNTER — Ambulatory Visit (HOSPITAL_BASED_OUTPATIENT_CLINIC_OR_DEPARTMENT_OTHER): Payer: 59 | Admitting: Orthopaedic Surgery

## 2023-12-29 ENCOUNTER — Ambulatory Visit (HOSPITAL_BASED_OUTPATIENT_CLINIC_OR_DEPARTMENT_OTHER)

## 2023-12-29 DIAGNOSIS — M25552 Pain in left hip: Secondary | ICD-10-CM

## 2023-12-29 DIAGNOSIS — M1712 Unilateral primary osteoarthritis, left knee: Secondary | ICD-10-CM | POA: Diagnosis not present

## 2023-12-29 NOTE — Progress Notes (Signed)
 Chief Complaint: Left worse than right hip pain left knee pain     History of Present Illness:    Ana [name] is a 53 y.o. female presents today as a referral from Dr. Karie Schwalbe for bilateral left worse than right hip.  As well as left knee pain.  She has previously seen Dr. Caswell Corwin regarding her hips and was found to have labral tearing bilaterally.  She has been attempting to avoid any type of hip preservation surgery due to concern for a longer recovery time.  She has undergone left knee arthroscopy for meniscal injury multiple years prior without any significant relief from this point.  She is also experiencing significant back pain as well.  She does enjoy being very active including hiking and jogging but all of this is essentially come to a stop due to her pain    PMH/PSH/Family History/Social History/Meds/Allergies:    Past Medical History:  Diagnosis Date   Asthma    intermittent   Borderline hyperlipidemia    No med trials in the past   Common migraine with intractable migraine 11/14/2015   Fibroids    GERD (gastroesophageal reflux disease)    controlled with diet   Headache 08/23/2015   Migraine headache    about 2 per yr   Seasonal allergies    Past Surgical History:  Procedure Laterality Date   ABDOMINAL HYSTERECTOMY  08/31/2012   Procedure: HYSTERECTOMY ABDOMINAL;  Surgeon: Turner Daniels, MD;  Location: [facility];  Service: Gynecology;  Laterality: N/A;   CESAREAN SECTION  2005, 2007   x2   DILATION AND CURETTAGE OF UTERUS     missed ab   KNEE ARTHROSCOPY     MYOMECTOMY     TONSILLECTOMY     WISDOM TOOTH EXTRACTION     Social History   Socioeconomic History   Marital status: Married    Spouse name: Matt   Number of children: 2   Years of education: College   Highest education level: Not on file  Occupational History    Employer: [employer]  Tobacco Use   Smoking status: Never   Smokeless tobacco: Never  Vaping Use   Vaping status: Never Used   Substance and Sexual Activity   Alcohol use: No    Alcohol/week: 0.0 standard drinks of alcohol    Comment: social 1 glass of wine monthly   Drug use: No   Sexual activity: Not on file  Other Topics Concern   Not on file  Social History Narrative   Married.  Two kids.   College grad--[place].   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   No T/A/Ds.   Caffeine Use: 1-2 cup daily   Patient is right handed.       Social Drivers of Corporate investment banker Strain: Not on file  Food Insecurity: Not on file  Transportation Needs: Not on file  Physical Activity: Not on file  Stress: Not on file  Social Connections: Not on file   Family History  Problem Relation Age of Onset   Arthritis Mother    Hypertension Mother    Hyperlipidemia Father    Hypertension Father    Cancer Paternal Grandmother        breast   Arthritis Maternal Grandmother    Heart disease Paternal Grandfather    Migraines Neg Hx    Allergies  Allergen Reactions   Amitriptyline Nausea And Vomiting   Augmentin [Amoxicillin-Pot Clavulanate]  Vomiting/diarrhea     Omnicef [Cefdinir] Other (See Comments)    GI upset   Trazodone And Nefazodone Nausea And Vomiting   Current Outpatient Medications  Medication Sig Dispense Refill   estradiol (ESTRACE) 2 MG tablet estradiol 2 mg tablet  TAKE 1 TABLET BY MOUTH EVERY DAY AS DIRECTED     gabapentin (NEURONTIN) 300 MG capsule One tab PO qHS for a week, then BID for a week, then TID. May double weekly to a max of 3,600mg /day 90 capsule 3   Galcanezumab-gnlm (EMGALITY) 120 MG/ML SOAJ Inject 120 mg into the skin every 30 (thirty) days. 1 mL 11   methocarbamol (ROBAXIN) 500 MG tablet Take 1 tablet (500 mg total) by mouth 3 (three) times daily. 90 tablet 0   naratriptan (AMERGE) 2.5 MG tablet Take 1 tablet (2.5 mg total) by mouth 2 (two) times daily as needed. 10 tablet 11   omeprazole (PRILOSEC) 40 MG capsule 1 capsule p.o. twice a day for 2 weeks and  then 1 capsule p.o. daily 60 capsule 11   phentermine (ADIPEX-P) 37.5 MG tablet TAKE 0.5 TABLETS (18.75 MG TOTAL) BY MOUTH DAILY BEFORE BREAKFAST. 15 tablet 2   topiramate (TOPAMAX) 25 MG tablet Take 1 tablet (25 mg total) by mouth at bedtime. 90 tablet 3   zolpidem (AMBIEN) 10 MG tablet TAKE 1 TABLET BY MOUTH EVERY DAY AT NIGHT 90 tablet 2   No current facility-administered medications for this visit.   No results found.  Review of Systems:   A ROS was performed including pertinent positives and negatives as documented in the HPI.  Physical Exam :   Constitutional: NAD and appears stated age Neurological: Alert and oriented Psych: Appropriate affect and cooperative Last menstrual period 07/30/2012.   Comprehensive Musculoskeletal Exam:    Bilateral femoral acetabular groin pain with positive FADIR.  30 degrees internal and 40 degrees external rotation of both hips with pain in internal rotation.  Positive external rotation lag passively.  Distal neurosensory exam is intact   Knee with some tenderness medially and a positive McMurray, negative Lachman, no varus or valgus laxity.  Range of motion is from -3 to 130 degrees  Imaging:   Xray (AP pelvis, 3 views right hip and left hip, 4 views left knee): Normal   I personally reviewed and interpreted the radiographs.   Assessment and Plan:   53 y.o. female with known bilateral hip labral tearing as well as left knee pain status post previous meniscal debridement.  Today's visit I do believe that the majority of her symptoms are likely emanating from the left hip.  At this time her x-ray does not show evidence of arthritis and overall I do believe she would be a candidate for hip preservation.  Given this I have recommended MRI of the left hip and left knee.  I will plan to see her back following discuss results  -Plan for MRI left hip and left knee   I personally saw and evaluated the patient, and participated in the management and  treatment plan.  Huel Cote, MD Attending Physician, Orthopedic Surgery  This document was dictated using Dragon voice recognition software. A reasonable attempt at proof reading has been made to minimize errors.

## 2023-12-30 ENCOUNTER — Encounter: Payer: Self-pay | Admitting: Sports Medicine

## 2024-01-12 ENCOUNTER — Other Ambulatory Visit (HOSPITAL_BASED_OUTPATIENT_CLINIC_OR_DEPARTMENT_OTHER): Payer: Self-pay | Admitting: Orthopaedic Surgery

## 2024-01-12 ENCOUNTER — Telehealth (HOSPITAL_BASED_OUTPATIENT_CLINIC_OR_DEPARTMENT_OTHER): Payer: Self-pay | Admitting: Orthopaedic Surgery

## 2024-01-12 DIAGNOSIS — Z96642 Presence of left artificial hip joint: Secondary | ICD-10-CM

## 2024-01-12 NOTE — Telephone Encounter (Signed)
 MRI ordered to GI

## 2024-01-12 NOTE — Telephone Encounter (Signed)
 Patient wants to know if her MRI was sent over to be sch? Best contact [phone]

## 2024-01-23 ENCOUNTER — Ambulatory Visit
Admission: RE | Admit: 2024-01-23 | Discharge: 2024-01-23 | Disposition: A | Discharge status: Home / Self Care | Source: Ambulatory Visit | Attending: Orthopaedic Surgery | Admitting: Orthopaedic Surgery

## 2024-01-23 DIAGNOSIS — Z96642 Presence of left artificial hip joint: Secondary | ICD-10-CM

## 2024-01-30 ENCOUNTER — Ambulatory Visit: Payer: Self-pay | Admitting: *Deleted

## 2024-01-30 NOTE — Telephone Encounter (Signed)
 Copied from CRM [id number]. Topic: Clinical - Red Word Triage >> Jan 30, 2024  8:09 AM Carrielelia G wrote: [facility] that prompted transfer to Nurse Triage: Left Leg knee down and foot is discolored. Dark pink, purple sometimes blue. Reason for Disposition  [1] MODERATE pain (e.g., interferes with normal activities, limping) AND [2] present > 3 days    Left foot discolored  Answer Assessment - Initial Assessment Questions 1. ONSET: "When did the pain start?"      Left foot is discolored.  When I get up it gets better.   It turns dark red.   When sitting my foot turns dark purple.   It started, not sure but yesterday I really noticed it.   I'm on vacation out of town with my family.    My husband noticed my foot was dark colored.   It's a red/pink color.   No pain in left foot.   It gets tingly when I stand up.     My whole leg always feels off.   I have pinched nerves in my lower back.    2. LOCATION: "Where is the pain located?"      Left foot. 3. PAIN: "How bad is the pain?"    (Scale 1-10; or mild, moderate, severe)  - MILD (1-3): doesn't interfere with normal activities.   - MODERATE (4-7): interferes with normal activities (e.g., work or school) or awakens from sleep, limping.   - SEVERE (8-10): excruciating pain, unable to do any normal activities, unable to walk.      Tingling but no pain 4. WORK OR EXERCISE: "Has there been any recent work or exercise that involved this part of the body?"      No   Notice it when sitting. I had an MRI on my knee and hip last week.  Don't know the results.   5. CAUSE: "What do you think is causing the foot pain?"     I don't know 6. OTHER SYMPTOMS: "Do you have any other symptoms?" (e.g., leg pain, rash, fever, numbness)     Tingling in leg and left foot getting discolored. 7. PREGNANCY: "Is there any chance you are pregnant?" "When was your last menstrual period?"     Not asked  Protocols used: Foot Pain-A-AH  Chief Complaint: Left foot is changing  colors especially when she sits. Symptoms: It turns purple and then pink/red when she stands up.   Mild tingling but no pain.    Has problems with left leg due to pinched nerves in lower back.   Had an MRI done last week but doesn't know results. Frequency: Noticed it yesterday Pertinent Negatives: Patient denies pain or injuries Disposition: [] ED /[x] Urgent Care (no appt availability in office) / [] Appointment(In office/virtual)/ []  [facility]/ [] Home Care/ [] Refused Recommended Disposition /[] [facility]/ []  Follow-up with PCP Additional Notes: She is out of town for [place] and the office is also closed today.    I encouraged her to go to the local urgent care.    I went over the s/s/ of possible blood clot in her leg and to go get it evaluated if those symptoms occurred which she was agreeable to doing.    The office schedules Dr. Ernestine Heads appt so she is going to call first thing Monday morning for an appt.

## 2024-02-02 ENCOUNTER — Encounter: Payer: Self-pay | Admitting: Medical-Surgical

## 2024-02-02 ENCOUNTER — Ambulatory Visit (INDEPENDENT_AMBULATORY_CARE_PROVIDER_SITE_OTHER): Admitting: Medical-Surgical

## 2024-02-02 VITALS — BP 116/73 | HR 85 | Resp 20 | Ht 64.0 in | Wt 125.1 lb

## 2024-02-02 DIAGNOSIS — L819 Disorder of pigmentation, unspecified: Secondary | ICD-10-CM

## 2024-02-02 NOTE — Telephone Encounter (Signed)
 Patient has upcoming appt scheduled today at 4pm with Cherre Cornish, NP .

## 2024-02-02 NOTE — Progress Notes (Signed)
        Established patient visit  History, exam, impression, and plan:  Very pleasant 53 year old female presenting today for evaluation of discoloration of the left foot.  [place] the [place] and sitting in her chair.  Her husband asked her what was wrong with her foot and when she looked down, her left foot was a purpleish blue discoloration and very swollen.  She does have a history of some swelling in the left lower extremity and foot however the discoloration was a new finding.  Has done some evaluation on her own and reports that her foot and leg feels fine without any pain, temperature change, numbness, or tingling.  Has found that she cannot sit for very long due to lumbar spine concerns.  Shortly after sitting down, the foot has shown a pattern of becoming discolored and begins to swell.  When she walks, the swelling and discoloration improves and her foot returns to normal color with improved swelling.  She does stand a lot at work but otherwise cannot think of anything that would be contributing to this.  On exam, able to visualize the discoloration happening on her foot.  When she is seated, foot becomes erythematous before turning a reddish-purple then purpleish blue.  On standing, the color returns to normal with in a couple of minutes.  During the discoloration, no change in temperature noted.  Capillary refill slightly reduced, sensation intact.  Pulses 2+ bilaterally.  Discoloration presents throughout the entire foot up to the lower ankle similar to a low-cut sock.  No discoloration from the ankle up.  No calf tenderness or other indication of a DVT.  Unclear etiology at this point.  We will check ABIs to evaluate pressures.  We did discuss doing ultrasound to rule out any sort of DVT or other vascular abnormality.  Patient is agreeable so both of these have been ordered.  Plan to follow-up with Dr. Sandy Crumb once testing has been done to review further evaluation if  indicated.  Procedures performed this visit: None.  Return if symptoms worsen or fail to improve.  __________________________________ Ana Snook, DNP, APRN, FNP-BC Primary Care and Sports Medicine [facility] [place]

## 2024-02-03 ENCOUNTER — Encounter: Payer: Self-pay | Admitting: Medical-Surgical

## 2024-02-03 IMAGING — CT CT HEAD W/O CM
4 of 6 series · 17 of 47 positions shown, 19 images · non-contrast
Comparison: CT head July 21, 2015.

CLINICAL DATA: Dizziness, non-specific



[Series 2: head wo · axial · 0.43mm/px · z∈[-113,+2]mm · 7 of 31 slices shown, 9 images]
[im 4/31  brain]
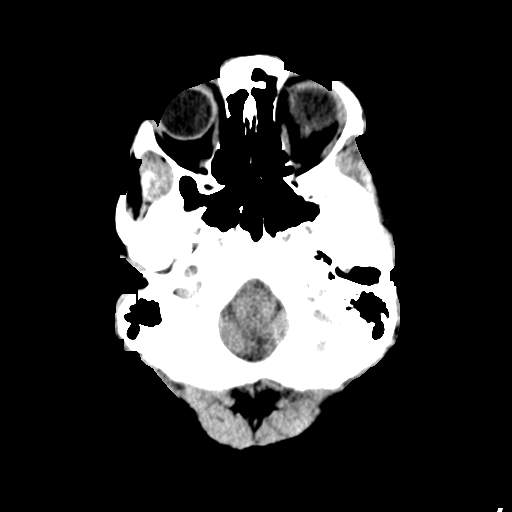
[im 4/31  bone]
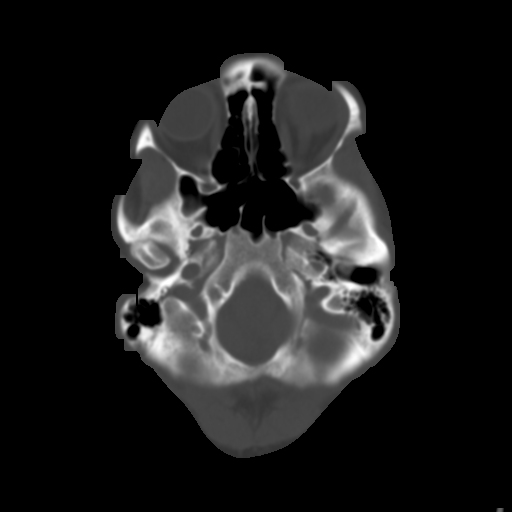
[im 8/31  brain]
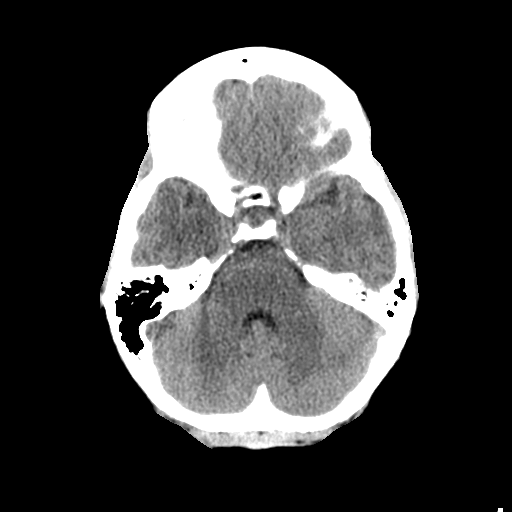
[im 12/31  brain]
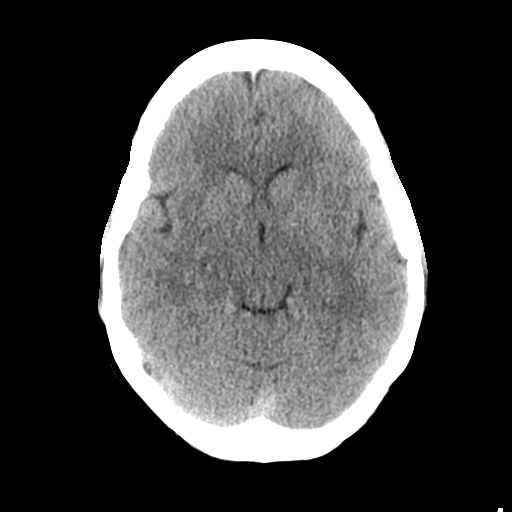
[im 16/31  brain]
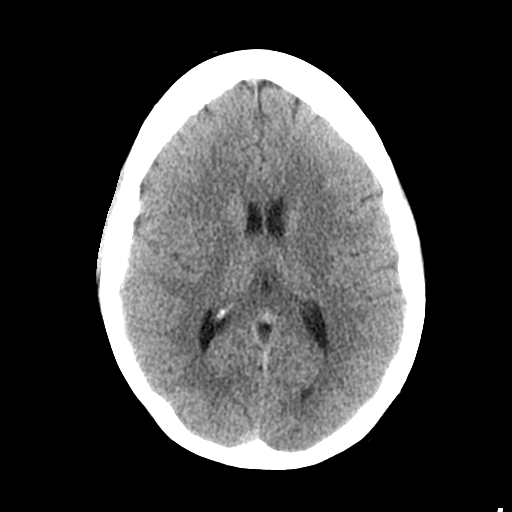
[im 19/31  brain]
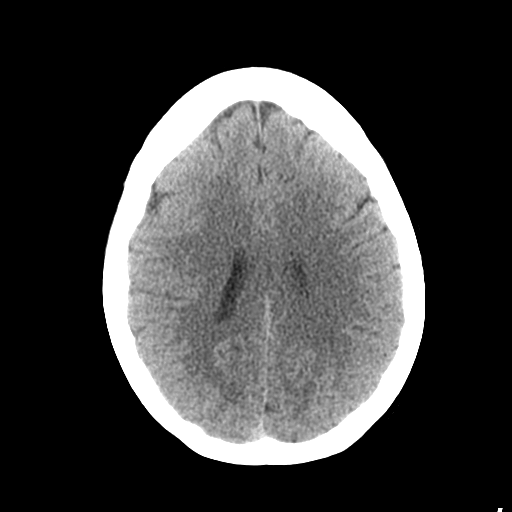
[im 19/31  bone]
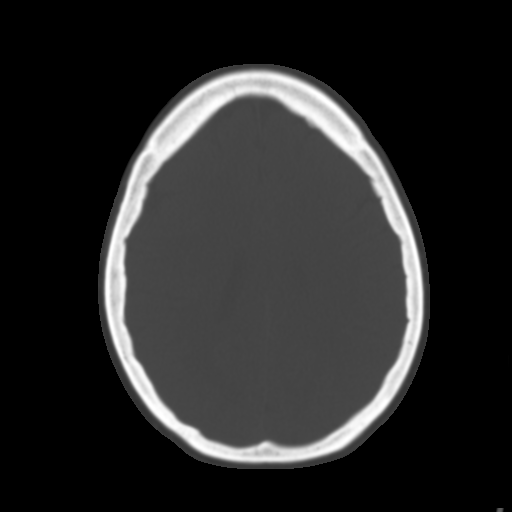
[im 23/31  brain]
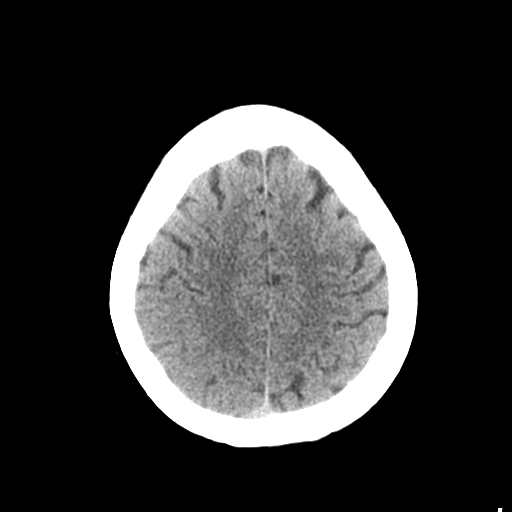
[im 27/31  brain]
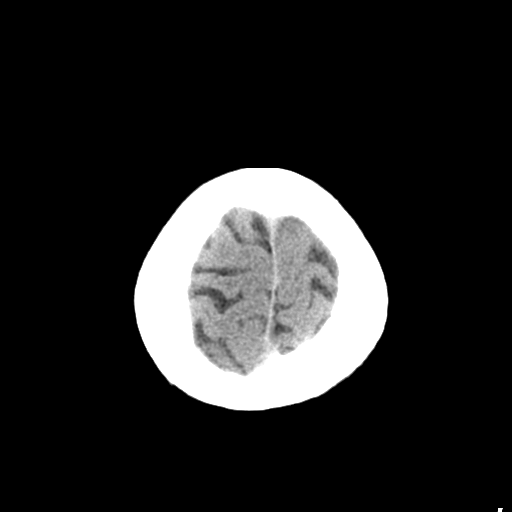

[Series 3: head bone · axial · 0.43mm/px · z∈[-119,-65]mm · 4 of 51 slices shown]
[im 4/51  bone]
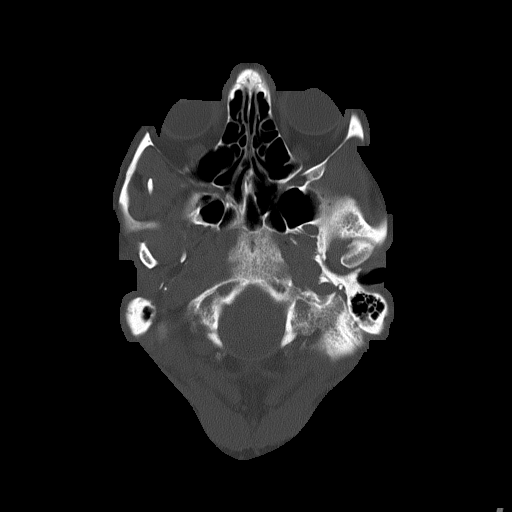
[im 11/51  bone]
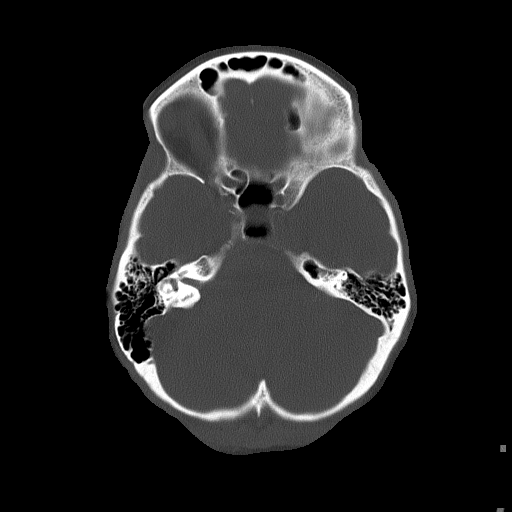
[im 18/51  bone]
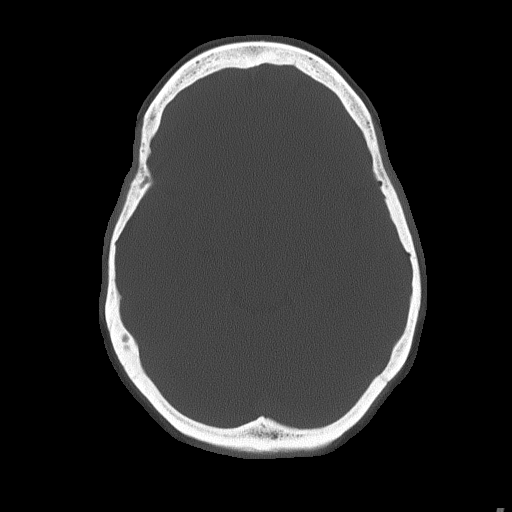
[im 22/51  bone]
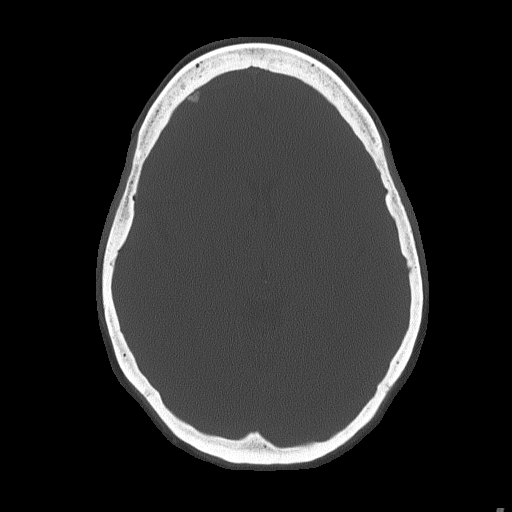

[Series 4: head wo coronal · coronal · 0.30mm/px · 3 of 67 slices shown]
[im 23/67  brain]
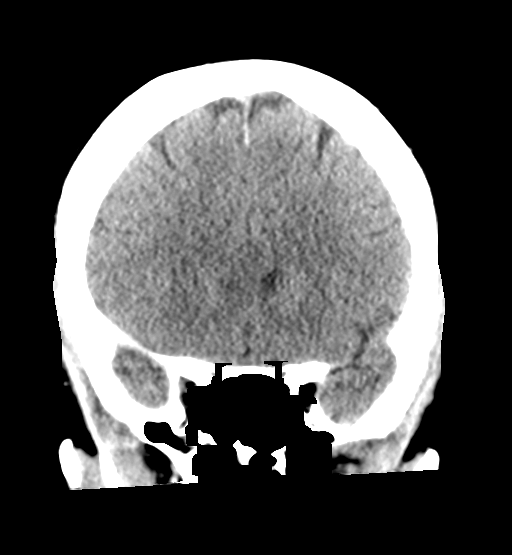
[im 30/67  brain]
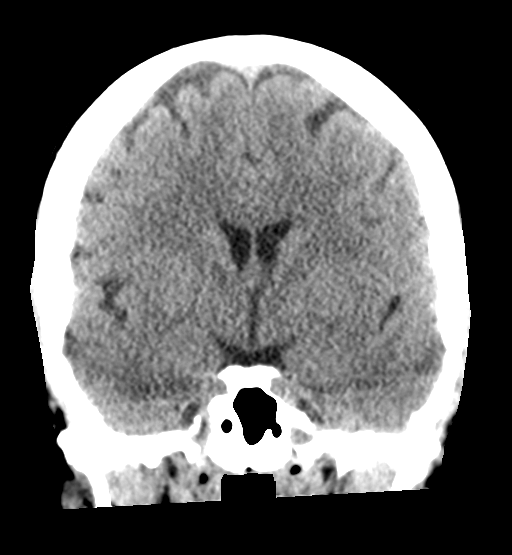
[im 37/67  brain]
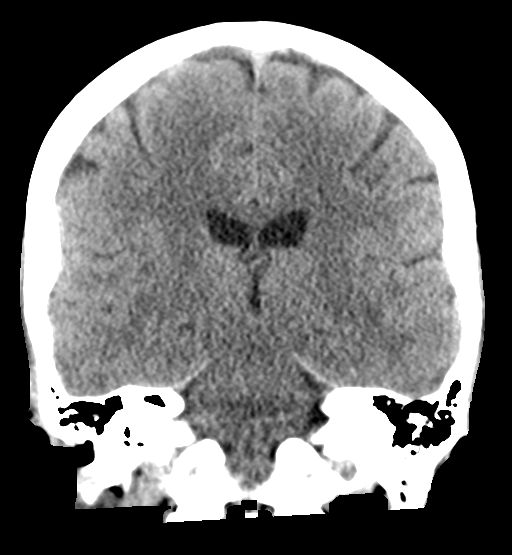

[Series 5: head wo sagittal · sagittal · 0.33mm/px · 3 of 53 slices shown]
[im 18/53  brain]
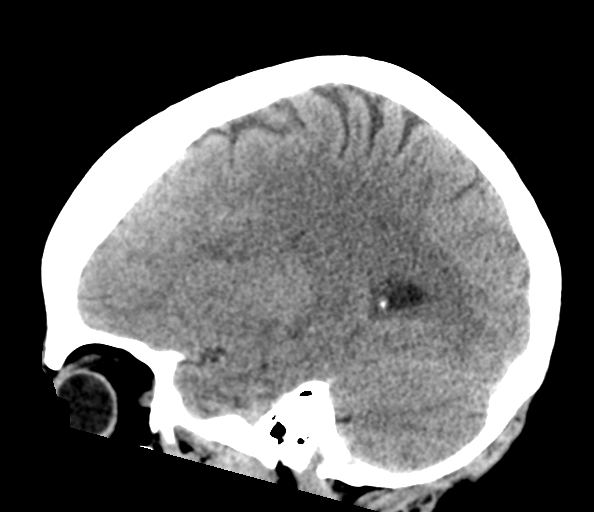
[im 27/53  brain]
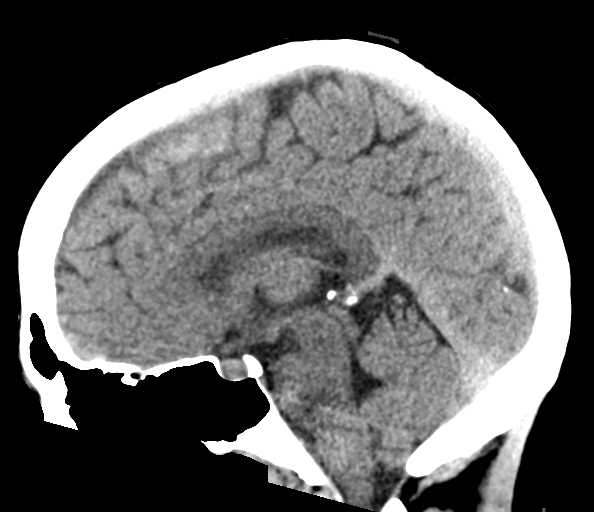
[im 35/53  brain]
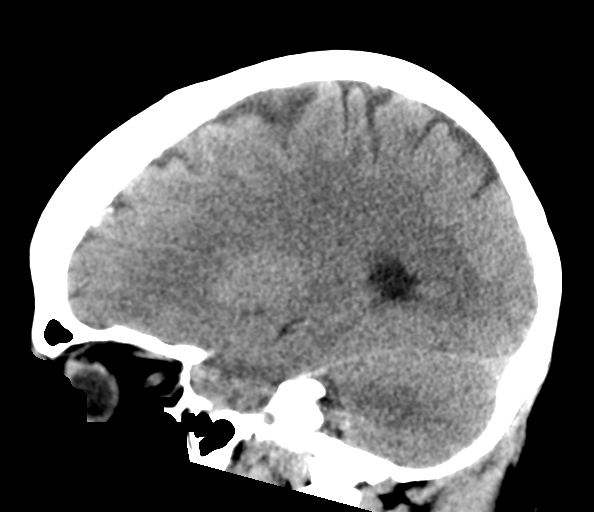

[17 of 47 positions shown; findings below may reference images not displayed]

FINDINGS: Brain: No evidence of acute infarction, hemorrhage, hydrocephalus,
extra-axial collection or mass lesion/mass effect. Small (7 mm) area
of bony hyperostosis along the inner table

Vascular: No hyperdense vessel identified.

Skull: No acute fracture.

Sinuses/Orbits: Clear sinuses.  No acute orbital findings.

Other: No mastoid effusions.
IMPRESSION: No evidence of acute intracranial abnormality.

## 2024-02-06 ENCOUNTER — Ambulatory Visit (HOSPITAL_BASED_OUTPATIENT_CLINIC_OR_DEPARTMENT_OTHER): Admitting: Orthopaedic Surgery

## 2024-02-13 ENCOUNTER — Ambulatory Visit

## 2024-02-13 ENCOUNTER — Ambulatory Visit: Admitting: Sports Medicine

## 2024-02-13 ENCOUNTER — Ambulatory Visit (INDEPENDENT_AMBULATORY_CARE_PROVIDER_SITE_OTHER): Admitting: Sports Medicine

## 2024-02-13 ENCOUNTER — Telehealth: Payer: Self-pay

## 2024-02-13 DIAGNOSIS — M79672 Pain in left foot: Secondary | ICD-10-CM

## 2024-02-13 DIAGNOSIS — M5416 Radiculopathy, lumbar region: Secondary | ICD-10-CM

## 2024-02-13 MED ORDER — PANTOPRAZOLE SODIUM 40 MG PO TBEC
40.0000 mg | DELAYED_RELEASE_TABLET | Freq: Every day | ORAL | 3 refills | Status: DC
Start: 2024-02-13 — End: 2024-06-07

## 2024-02-13 MED ORDER — CELECOXIB 200 MG PO CAPS: ORAL_CAPSULE | ORAL | 2 refills | Status: DC

## 2024-02-13 NOTE — Telephone Encounter (Signed)
 Patient informed.

## 2024-02-13 NOTE — Assessment & Plan Note (Signed)
 Ana [name] is also having some pain in the dorsum of her left forefoot, overall on inspection she does have some visible swelling compared to the contralateral side, she has discrete tenderness over the 2nd through 4th metatarsal shafts distally. Although her discomfort could be radicular, the reproduction of pain with palpation of her toes indicates there may be a primary foot process, we will get x-rays, she will wear a Morton's plate in her shoe, and if insufficient improvement at the follow-up we will consider MRI.

## 2024-02-13 NOTE — Telephone Encounter (Signed)
 Spoke with patient  States that he usually wears open toed heeled dress shoes. When uses the stabilizer plate  her foot slips forward and out of shoe.  She is at work for 12-14 hours per day Asked if she had any closed toed dress shoes and she states that she really does not but she is willing to go and purchase some if this is necessary. She would like any other  suggestions Dr. Sandy Crumb has if any regarding this.

## 2024-02-13 NOTE — Telephone Encounter (Signed)
 Copied from CRM [id number]. Topic: Clinical - Medical Advice >> Feb 13, 2024 12:33 PM Brittney F wrote: Reason for CRM:   Patient called in due to looking into purchasing a fiber glass or alluminum insert to assist with a fracture; The insert is not compatible with her work foot wear; Patient is asking if Dr. Elva Hamburger has an alternative recommendation or would he recommend a boot.  Callback Number: [id number]

## 2024-02-13 NOTE — Progress Notes (Signed)
    Procedures performed today:    None.  Independent interpretation of notes and tests performed by another provider:   None.  Brief History, Exam, Impression, and Recommendations:    Left lumbar radiculopathy Ana [name] returns, she is a very pleasant 53 year old female, she has known lumbar DDD, facet arthropathy, we have been treating her longitudinally, after failure of conservative treatment including physical therapy and Neurontin  we proceeded with a left L4-L5 interlaminar epidural, she got good relief albeit temporary. Her left knee pain and left quadratus lumborum pain also improved after the epidural. She did decrease her gabapentin . Now she is having a recurrence of discomfort, back radiating down the leg, she does feel some pain in the knee as well. She has some swelling and mottling of her left foot. I explained to her the ideology of lumbar disc disease, the lack of a definitive cure and the need to treat this longitudinally, she is interested in a surgical opinion which I think is appropriate. She also had some questions about facet radiofrequency ablation, I did inform her that with the success of the epidural I think the disc and nerve is the most likely pain generator although it would be reasonable to try facet medial Buzzelli blocks and potentially RFA if the epidurals fail.  She understands that medial Cossey blocks and RFA are temporizing. I think that she has developed a mood disorder secondary to her medical disease, and I did plant the seed that medications such as SNRIs could be helpful for both. We will get her consultation with Dr. Jackee Marus. She is definitely a candidate for follow-up and subsequent epidurals.  Left foot pain Ana [name] is also having some pain in the dorsum of her left forefoot, overall on inspection she does have some visible swelling compared to the contralateral side, she has discrete tenderness over the 2nd through 4th metatarsal shafts  distally. Although her discomfort could be radicular, the reproduction of pain with palpation of her toes indicates there may be a primary foot process, we will get x-rays, she will wear a Morton's plate in her shoe, and if insufficient improvement at the follow-up we will consider MRI.  I spent 30 minutes of total time managing this patient today, this includes chart review, face to face, and non-face to face time.  ____________________________________________ Joselyn Nicely. Sandy Crumb, M.D., ABFM., CAQSM., AME. Primary Care and Sports Medicine [facility]  Adjunct Professor of [facility] Medicine  [employer]  Restaurant manager, fast food

## 2024-02-13 NOTE — Assessment & Plan Note (Signed)
 Ana [name] returns, she is a very pleasant 53 year old female, she has known lumbar DDD, facet arthropathy, we have been treating her longitudinally, after failure of conservative treatment including physical therapy and Neurontin  we proceeded with a left L4-L5 interlaminar epidural, she got good relief albeit temporary. Her left knee pain and left quadratus lumborum pain also improved after the epidural. She did decrease her gabapentin . Now she is having a recurrence of discomfort, back radiating down the leg, she does feel some pain in the knee as well. She has some swelling and mottling of her left foot. I explained to her the ideology of lumbar disc disease, the lack of a definitive cure and the need to treat this longitudinally, she is interested in a surgical opinion which I think is appropriate. She also had some questions about facet radiofrequency ablation, I did inform her that with the success of the epidural I think the disc and nerve is the most likely pain generator although it would be reasonable to try facet medial Cudd blocks and potentially RFA if the epidurals fail.  She understands that medial [place] blocks and RFA are temporizing. I think that she has developed a mood disorder secondary to her medical disease, and I did plant the seed that medications such as SNRIs could be helpful for both. We will get her consultation with Dr. Jackee Marus. She is definitely a candidate for follow-up and subsequent epidurals.

## 2024-02-13 NOTE — Telephone Encounter (Signed)
 I would suggest dress flats with the Morton's plate if possible.

## 2024-02-13 NOTE — Patient Instructions (Signed)
 Ana [name]

## 2024-02-13 NOTE — Telephone Encounter (Signed)
 Maybe need a little bit more detail on this one, it is because she has to wear heels?  Is it possible she only wear the work shoes when she has to and then all of the times where the regular shoe with a Morton's plate?

## 2024-02-18 ENCOUNTER — Ambulatory Visit (HOSPITAL_BASED_OUTPATIENT_CLINIC_OR_DEPARTMENT_OTHER): Admitting: Orthopaedic Surgery

## 2024-03-01 ENCOUNTER — Ambulatory Visit (HOSPITAL_BASED_OUTPATIENT_CLINIC_OR_DEPARTMENT_OTHER)

## 2024-03-04 ENCOUNTER — Other Ambulatory Visit: Payer: Self-pay | Admitting: Sports Medicine

## 2024-03-04 DIAGNOSIS — M5030 Other cervical disc degeneration, unspecified cervical region: Secondary | ICD-10-CM

## 2024-03-18 ENCOUNTER — Other Ambulatory Visit: Payer: Self-pay | Admitting: Medical-Surgical

## 2024-03-18 ENCOUNTER — Other Ambulatory Visit: Payer: Self-pay | Admitting: Sports Medicine

## 2024-03-18 DIAGNOSIS — L819 Disorder of pigmentation, unspecified: Secondary | ICD-10-CM

## 2024-03-19 ENCOUNTER — Other Ambulatory Visit: Payer: Self-pay | Admitting: Sports Medicine

## 2024-03-25 ENCOUNTER — Ambulatory Visit: Admitting: Sports Medicine

## 2024-04-02 ENCOUNTER — Ambulatory Visit (INDEPENDENT_AMBULATORY_CARE_PROVIDER_SITE_OTHER): Admitting: Sports Medicine

## 2024-04-02 DIAGNOSIS — M5416 Radiculopathy, lumbar region: Secondary | ICD-10-CM | POA: Diagnosis not present

## 2024-04-02 DIAGNOSIS — Z0000 Encounter for general adult medical examination without abnormal findings: Secondary | ICD-10-CM

## 2024-04-02 DIAGNOSIS — M2550 Pain in unspecified joint: Secondary | ICD-10-CM

## 2024-04-02 DIAGNOSIS — M79672 Pain in left foot: Secondary | ICD-10-CM

## 2024-04-02 DIAGNOSIS — M25552 Pain in left hip: Secondary | ICD-10-CM

## 2024-04-02 NOTE — Progress Notes (Signed)
 Subjective:    CC: Annual Physical Exam  HPI:  This patient is here for their annual physical  I reviewed the past medical history, family history, social history, surgical history, and allergies today and no changes were needed.  Please see the problem list section below in epic for further details.  Past Medical History: Past Medical History:  Diagnosis Date   Asthma    intermittent   Borderline hyperlipidemia    No med trials in the past   Common migraine with intractable migraine 11/14/2015   Fibroids    GERD (gastroesophageal reflux disease)    controlled with diet   Headache 08/23/2015   Migraine headache    about 2 per yr   Seasonal allergies    Past Surgical History: Past Surgical History:  Procedure Laterality Date   ABDOMINAL HYSTERECTOMY  08/31/2012   Procedure: HYSTERECTOMY ABDOMINAL;  Surgeon: Denette Finner, MD;  Location: [facility];  Service: Gynecology;  Laterality: N/A;   CESAREAN SECTION  2005, 2007   x2   DILATION AND CURETTAGE OF UTERUS     missed ab   KNEE ARTHROSCOPY     MYOMECTOMY     TONSILLECTOMY     WISDOM TOOTH EXTRACTION     Social History: Social History   Socioeconomic History   Marital status: Married    Spouse name: Matt   Number of children: 2   Years of education: College   Highest education level: Not on file  Occupational History    Employer: [employer]  Tobacco Use   Smoking status: Never   Smokeless tobacco: Never  Vaping Use   Vaping status: Never Used  Substance and Sexual Activity   Alcohol use: No    Alcohol/week: 0.0 standard drinks of alcohol    Comment: social 1 glass of wine monthly   Drug use: No   Sexual activity: Not on file  Other Topics Concern   Not on file  Social History Narrative   Married.  Two kids.   College grad--[employer].   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   No T/A/Ds.   Caffeine Use: 1-2 cup daily   Patient is right handed.       Social Drivers of Research scientist (physical sciences) Strain: Not on file  Food Insecurity: Not on file  Transportation Needs: Not on file  Physical Activity: Not on file  Stress: Not on file  Social Connections: Not on file   Family History: Family History  Problem Relation Age of Onset   Arthritis Mother    Hypertension Mother    Hyperlipidemia Father    Hypertension Father    Cancer Paternal Grandmother        breast   Arthritis Maternal Grandmother    Heart disease Paternal Grandfather    Migraines Neg Hx    Allergies: Allergies  Allergen Reactions   Amitriptyline Nausea And Vomiting   Augmentin  [Amoxicillin -Pot Clavulanate]     Vomiting/diarrhea     Omnicef [Cefdinir] Other (See Comments)    GI upset   Trazodone  And Nefazodone Nausea And Vomiting   Medications: See med rec.  Review of Systems: No headache, visual changes, nausea, vomiting, diarrhea, constipation, dizziness, abdominal pain, skin rash, fevers, chills, night sweats, swollen lymph nodes, weight loss, chest pain, body aches, joint swelling, muscle aches, shortness of breath, mood changes, visual or auditory hallucinations.  Objective:    General: Well Developed, well nourished, and in no acute distress.  Neuro: Alert and  oriented x3, extra-ocular muscles intact, sensation grossly intact. Cranial nerves II through XII are intact, motor, sensory, and coordinative functions are all intact. HEENT: Normocephalic, atraumatic, pupils equal round reactive to light, neck supple, no masses, no lymphadenopathy, thyroid nonpalpable. Oropharynx, nasopharynx, external ear canals are unremarkable. Skin: Warm and dry, no rashes noted.  Cardiac: Regular rate and rhythm, no murmurs rubs or gallops.  Respiratory: Clear to auscultation bilaterally. Not using accessory muscles, speaking in full sentences.  Abdominal: Soft, nontender, nondistended, positive bowel sounds, no masses, no organomegaly.  Musculoskeletal: Shoulder, elbow, wrist, hip, knee, ankle stable, and with  full range of motion.  Impression and Recommendations:    The patient was counselled, risk factors were discussed, anticipatory guidance given.  Annual physical exam Annual physical. Ana [name] does have her mammogram scheduled at an outside facility, she will make sure the results are forwarded to us . She will do her shingles with her pharmacy. She is status post hysterectomy. Routine labs ordered.  Left lumbar radiculopathy Ana [name] returns, she is a very pleasant 53 year old female, known lumbar DDD, facet arthropathy, she has been treated longitudinally, a left L4-L5 interlaminar epidural gave her some good relief, she has responded to Neurontin  and has also responded to Celebrex . She did work with Dr. Jackee Marus who did not see a surgical lesion. She is hoping to get off of her medications, but also understands this would mean worsening symptomatology and quality of life, she has some thinking to do regarding long-term goals here.  Left foot pain Ana [name] has some left dorsal forefoot and lower leg swelling, occasional mottling. She does have more pain on the weeks that she works, she spends a lot of time on her feet or sitting. During the weekends and on vacation when she moves around a lot symptoms improved/resolved. She did have some tenderness 2nd through 4th metatarsal shaft. Negative Homans' sign and good pulses. We got her a Morton's plate, she has improved to some degree. At this point we are not going to aggressively investigate her foot although the neck step would be an MRI. I do think the mottling may be related to vasomotor instability and her left-sided radiculopathy. I have suggested lower extremity compression stockings in the meantime.   Left hip pain Chronic left hip pain, anterior with loss of motion, most recent noncontrast MRI did not show labral tearing however she does have a right sided labral tear and her symptoms are suggestive, at this point things are going okay  with Celebrex  that she wants to come off of it. She has had multiple steroid injections, therapy, activity modification. If she does desire another MR arthrography I would consider injecting leukocyte depleted PRP with the contrast. She will look into this.  ____________________________________________ Ana [name] Nicely. Sandy Crumb, M.D., ABFM., CAQSM., AME. Primary Care and Sports Medicine [facility]  Adjunct Professor of [facility] Medicine  [employer]  Restaurant manager, fast food

## 2024-04-02 NOTE — Assessment & Plan Note (Signed)
 Annual physical. Ana [name] does have her mammogram scheduled at an outside facility, she will make sure the results are forwarded to us . She will do her shingles with her pharmacy. She is status post hysterectomy. Routine labs ordered.

## 2024-04-02 NOTE — Assessment & Plan Note (Signed)
 Ana [name] has some left dorsal forefoot and lower leg swelling, occasional mottling. She does have more pain on the weeks that she works, she spends a lot of time on her feet or sitting. During the weekends and on vacation when she moves around a lot symptoms improved/resolved. She did have some tenderness 2nd through 4th metatarsal shaft. Negative Homans' sign and good pulses. We got her a Morton's plate, she has improved to some degree. At this point we are not going to aggressively investigate her foot although the neck step would be an MRI. I do think the mottling may be related to vasomotor instability and her left-sided radiculopathy. I have suggested lower extremity compression stockings in the meantime.

## 2024-04-02 NOTE — Assessment & Plan Note (Signed)
 Chronic left hip pain, anterior with loss of motion, most recent noncontrast MRI did not show labral tearing however she does have a right sided labral tear and her symptoms are suggestive, at this point things are going okay with Celebrex  that she wants to come off of it. She has had multiple steroid injections, therapy, activity modification. If she does desire another MR arthrography I would consider injecting leukocyte depleted PRP with the contrast. She will look into this.

## 2024-04-02 NOTE — Assessment & Plan Note (Signed)
 Ana [name] returns, she is a very pleasant 53 year old female, known lumbar DDD, facet arthropathy, she has been treated longitudinally, a left L4-L5 interlaminar epidural gave her some good relief, she has responded to Neurontin  and has also responded to Celebrex . She did work with Dr. Jackee Marus who did not see a surgical lesion. She is hoping to get off of her medications, but also understands this would mean worsening symptomatology and quality of life, she has some thinking to do regarding long-term goals here.

## 2024-04-05 NOTE — Progress Notes (Signed)
 I must of copy and paste the note by accident. Sorry about that .

## 2024-06-04 ENCOUNTER — Other Ambulatory Visit: Payer: Self-pay | Admitting: Sports Medicine

## 2024-06-04 DIAGNOSIS — M5416 Radiculopathy, lumbar region: Secondary | ICD-10-CM

## 2024-06-15 ENCOUNTER — Encounter: Payer: Self-pay | Admitting: Sports Medicine

## 2024-07-20 ENCOUNTER — Other Ambulatory Visit (HOSPITAL_BASED_OUTPATIENT_CLINIC_OR_DEPARTMENT_OTHER): Payer: Self-pay

## 2024-07-20 DIAGNOSIS — M5416 Radiculopathy, lumbar region: Secondary | ICD-10-CM

## 2024-07-20 MED ORDER — CELECOXIB 200 MG PO CAPS: ORAL_CAPSULE | ORAL | 0 refills | Status: DC

## 2024-08-03 ENCOUNTER — Encounter: Payer: Self-pay | Admitting: Family Medicine

## 2024-08-03 ENCOUNTER — Telehealth: Payer: Self-pay

## 2024-08-03 NOTE — Telephone Encounter (Signed)
 Copied from CRM [id number]. Topic: Appointments - Transfer of Care >> Aug 03, 2024 12:54 PM Alfonso ORN wrote: Pt is requesting to transfer FROM: Debby Petties Pt is requesting to transfer TO: Aleene Hockey Reason for requested transfer: pcp no longer at clinic It is the responsibility of the team the patient would like to transfer to (Dr. Hockey) to reach out to the patient if for any reason this transfer is not acceptable.   FYI, current transfer of care appt is 12/17

## 2024-08-03 NOTE — Telephone Encounter (Signed)
 Yes okay for transfer

## 2024-08-03 NOTE — Telephone Encounter (Signed)
 No further action needed.

## 2024-08-09 ENCOUNTER — Ambulatory Visit: Admitting: Family Medicine

## 2024-08-09 VITALS — BP 110/78 | Ht 64.0 in | Wt 125.0 lb

## 2024-08-09 DIAGNOSIS — S73191D Other sprain of right hip, subsequent encounter: Secondary | ICD-10-CM

## 2024-08-09 DIAGNOSIS — M25551 Pain in right hip: Secondary | ICD-10-CM

## 2024-08-09 DIAGNOSIS — M1712 Unilateral primary osteoarthritis, left knee: Secondary | ICD-10-CM | POA: Diagnosis not present

## 2024-08-09 NOTE — Progress Notes (Signed)
 PCP: Patient, No Pcp Per  Discussed the use of AI scribe software for clinical note transcription with the patient, who gave verbal consent to proceed.  History of Present Illness Ana SUMNERS is a 53 year old female with chronic musculoskeletal issues who presents with worsening left knee and hip pain.  Left knee pain - Chronic pain with recent worsening - History of prior surgeries (meniscectomies) - Current symptoms include burning pain at the joint, occasional swelling, and sensation of a lump posteromedial knee when on feet a lot - Arthritis present; possible recurrent meniscus tear she's been told in the past - Pain impacts daily activities and exercise  Right hip pain - Chronic pain associated with torn labrum - Managed conservatively to date without benefit for several months - Pain worsens with activities involving leg lifting - Symptoms include catching and locking of the hip - Difficulty with daily tasks - Discontinued activities such as Pure [employer] and other exercises due to pain  Left lower extremity neuropathic pain - Nerve pain radiating down the left leg - Triggered by prolonged sitting or certain movements - Ongoing for approximately one year - Pain extends to the hip and ribs, occasionally making it painful to breathe - Currently taking gabapentin  at bedtime for symptom management    Past Medical History:  Diagnosis Date   Asthma    intermittent   Borderline hyperlipidemia    No med trials in the past   Chronic left hip pain    Chronic pain of left knee    Colon cancer screening    11/2022 cologuard NEG   DDD (degenerative disc disease), lumbar    GERD (gastroesophageal reflux disease)    controlled with diet   Left lumbar radiculopathy    [facility]I has helped.  Dr Apolinar surgical lesion   Lumbar facet arthropathy    Migraine headache    about 2 per yr.  MR brain normal 2023   Seasonal allergies     Current Outpatient Medications on File Prior to  Visit  Medication Sig Dispense Refill   celecoxib  (CELEBREX ) 200 MG capsule NEEDS NEW PCP. One to 2 tablets by mouth daily as needed for pain. 60 capsule 0   estradiol (ESTRACE) 2 MG tablet estradiol 2 mg tablet  TAKE 1 TABLET BY MOUTH EVERY DAY AS DIRECTED     gabapentin  (NEURONTIN ) 300 MG capsule ONE TAB BY MOUTH AT BEDTIME FOR A WEEK, THEN TWICE A DAY FOR A WEEK, THEN 3 TIMES A DAY . MAY DOUBLE WEEKLY TO A MAX OF 3,600MG /DAY 90 capsule 3   Galcanezumab -gnlm (EMGALITY ) 120 MG/ML SOAJ Inject 120 mg into the skin every 30 (thirty) days. 1 mL 11   naratriptan  (AMERGE) 2.5 MG tablet Take 1 tablet (2.5 mg total) by mouth 2 (two) times daily as needed. 10 tablet 11   pantoprazole  (PROTONIX ) 40 MG tablet TAKE 1 TABLET BY MOUTH EVERY DAY 30 tablet 3   zolpidem  (AMBIEN ) 10 MG tablet TAKE 1 TABLET BY MOUTH EVERY DAY AT NIGHT 90 tablet 2   No current facility-administered medications on file prior to visit.    Past Surgical History:  Procedure Laterality Date   ABDOMINAL HYSTERECTOMY  08/31/2012   Procedure: HYSTERECTOMY ABDOMINAL;  Surgeon: Alm JAYSON Cook, MD;  Location: [facility];  Service: Gynecology;  Laterality: N/A;   CESAREAN SECTION  2005, 2007   x2   DILATION AND CURETTAGE OF UTERUS     missed ab   KNEE ARTHROSCOPY  MYOMECTOMY     TONSILLECTOMY     WISDOM TOOTH EXTRACTION      Allergies  Allergen Reactions   Amitriptyline Nausea And Vomiting   Augmentin  [Amoxicillin -Pot Clavulanate]     Vomiting/diarrhea     Omnicef [Cefdinir] Other (See Comments)    GI upset   Trazodone  And Nefazodone Nausea And Vomiting    BP 110/78   Ht 5' 4 (1.626 m)   Wt 125 lb (56.7 kg)   LMP 07/30/2012   BMI 21.46 kg/m       No data to display              No data to display              Objective:  Physical Exam:  Gen: NAD, comfortable in exam room  Left knee: No gross deformity, ecchymoses, swelling. No TTP currently. FROM with normal strength. Negative ant/post drawers.  Negative valgus/varus testing. Negative lachman.  Negative mcmurrays, apleys, thessalys NV intact distally.  Right hip: No deformity. Mild limitation IR .  Otherwise full range of motion with 5/5 strength. No tenderness to palpation . Neurovascularly intact distally. Positive logroll Positive fadir.  Negative faber.  Assessment & Plan Left knee osteoarthritis with history of meniscus tears and prior surgeries Chronic left knee pain with suspected meniscus tear and possible Baker's cyst. Conservative management preferred due to previous surgeries. - Obtain approval for Visco supplementation for left knee - had this once over 2 years ago with good benefit. - Continue conservative management including celebrex .  Right hip labral tear with chronic pain and functional limitation Chronic right hip pain with functional limitations and catching sensation. MR arthrogram preferred for labral tear assessment. - Order MR arthrogram for right hip to assess labral tear. - Consider referral to Dr. Selinda Gosling for further evaluation and management.  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  Not as symptomatic as right - will continue conservative treatment for now.  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 - Continue gabapentin  at bedtime for nerve pain.  Left wrist tendinitis (likely [employer]) Noted at end of visit.  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 Conservative management preferred. - Apply ice to left wrist. - Use Voltaren gel for pain management. - Consider thumb spica brace if pain persists.

## 2024-08-09 NOTE — Patient Instructions (Signed)
  VISIT SUMMARY: Today, we discussed your chronic musculoskeletal issues, including worsening pain in your left knee and right hip. We also reviewed your right wrist pain  YOUR PLAN: -LEFT KNEE OSTEOARTHRITIS WITH HISTORY OF MENISCUS TEARS AND PRIOR SURGERIES: You have chronic pain in your left knee, which has worsened recently. This is likely due to arthritis and possibly a recurrent meniscus tear. We will seek approval for Visco supplementation to help manage the pain and continue with conservative treatments.  -RIGHT HIP LABRAL TEAR WITH CHRONIC PAIN AND FUNCTIONAL LIMITATION: You have chronic pain in your right hip, likely due to a labral tear. This causes a catching sensation and limits your activities. We will order an MR arthrogram to assess the tear and may refer you to Dr. Selinda Gosling for further evaluation.  -LEFT HIP LABRAL TEAR WITH CHRONIC PAIN: You have chronic pain in your left hip due to a labral tear. We will continue to manage this conservatively.  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 Apply ice to your wrist, use Voltaren gel for pain, and consider using a thumb spica brace if the pain continues.  INSTRUCTIONS: We will seek approval for Visco supplementation for your left knee. An MR arthrogram will be ordered for your right hip, and you may be referred to Dr. Selinda Gosling for further evaluation. Continue taking gabapentin  at bedtime for your nerve pain. Apply ice and use Voltaren gel for your right wrist pain, and consider a thumb spica brace if needed. Monitor your kidney function yearly while on Celebrex , and consider reducing the dosage to every other day or discontinuing it to assess the impact on your GERD symptoms.

## 2024-08-16 ENCOUNTER — Encounter: Payer: Self-pay | Admitting: Radiology

## 2024-08-19 ENCOUNTER — Encounter: Payer: Self-pay | Admitting: Family Medicine

## 2024-08-19 ENCOUNTER — Ambulatory Visit (INDEPENDENT_AMBULATORY_CARE_PROVIDER_SITE_OTHER): Admitting: Family Medicine

## 2024-08-19 VITALS — BP 110/70 | HR 77 | Temp 98.6°F | Ht 64.5 in | Wt 133.2 lb

## 2024-08-19 DIAGNOSIS — K219 Gastro-esophageal reflux disease without esophagitis: Secondary | ICD-10-CM

## 2024-08-19 DIAGNOSIS — Z791 Long term (current) use of non-steroidal anti-inflammatories (NSAID): Secondary | ICD-10-CM

## 2024-08-19 NOTE — Progress Notes (Signed)
 Office Note 08/19/2024  CC:  Chief Complaint  Patient presents with   Establish Care   HPI:  Ana [name] is a 53 y.o.  female who is here to establish care, health maintenance exam. Patient's most recent primary MD: Dr. Curtis. Old records in [facility] EMR were reviewed prior to or during today's visit.   GERD: Has had for many years.  Symptoms sometimes flare and require over-the-counter Pepcid in addition to her daily Protonix .  Has never had EGD.  Dealing with lots of pain in the left knee and right hip of late.  Also has some pain radiating down the left leg.  She has recently established with Dr. Cleatrice, is going to start getting Euflexxa injections in left knee.  MR arthrogram of right hip is set for 08/23/2024. Has been taking Celebrex  200 to 400 mg daily lately.  Her Ambien  is prescribed by her GYN MD, Dr. Alm Cook. No red flags.  Past Medical History:  Diagnosis Date   Asthma    intermittent   Bilateral hip pain    R labral tear MRI 2021.  L MRI essentially normal 01/2024.   Borderline hyperlipidemia    No med trials in the past   Cervical spondylosis    Chronic pain of left knee    osteoarthritis, hx of meniscus tear and surg debridement   Colon cancer screening    11/2022 cologuard NEG   GERD (gastroesophageal reflux disease)    controlled with diet   Insomnia    Left lumbar radiculopathy    [facility]I has helped.  Dr Apolinar surgical lesion   Lumbar facet arthropathy    Migraine syndrome    Neurologist   Seasonal allergies     Past Surgical History:  Procedure Laterality Date   ABDOMINAL HYSTERECTOMY  08/31/2012   Procedure: HYSTERECTOMY ABDOMINAL;  Surgeon: Alm JAYSON Cook, MD;  Location: [facility];  Service: Gynecology;  Laterality: N/A;   CESAREAN SECTION  2005, 2007   x2   DILATION AND CURETTAGE OF UTERUS     missed ab   KNEE ARTHROSCOPY Left    meniscal debridement   MYOMECTOMY     TONSILLECTOMY     WISDOM TOOTH EXTRACTION       Family History  Problem Relation Age of Onset   Arthritis Mother    Hypertension Mother    Hyperlipidemia Father    Hypertension Father    Arthritis Maternal Grandmother    Breast cancer Paternal Grandmother        breast   Heart disease Paternal Grandfather    Migraines Neg Hx     Social History   Socioeconomic History   Marital status: Married    Spouse name: [employer]   Number of children: 2   Years of education: College   Highest education level: Not on file  Occupational History    Employer: [employer]  Tobacco Use   Smoking status: Never   Smokeless tobacco: Never  Vaping Use   Vaping status: Never Used  Substance and Sexual Activity   Alcohol use: No    Alcohol/week: 0.0 standard drinks of alcohol    Comment: social 1 glass of wine monthly   Drug use: No   Sexual activity: Yes    Partners: Male    Birth control/protection: None    Comment: married  Other Topics Concern   Not on file  Social History Narrative   Married.  Two kids.   College grad--[employer].   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   No T/A/Ds.   Caffeine Use: 1-2 cup daily   Patient is right handed.       Social Drivers of Corporate Investment Banker Strain: Not on file  Food Insecurity: Not on file  Transportation Needs: Not on file  Physical Activity: Not on file  Stress: Not on file  Social Connections: Not on file  Intimate Partner Violence: Not on file    Outpatient Encounter Medications as of 08/19/2024  Medication Sig   celecoxib  (CELEBREX ) 200 MG capsule NEEDS NEW PCP. One to 2 tablets by mouth daily as needed for pain.   estradiol (ESTRACE) 2 MG tablet estradiol 2 mg tablet  TAKE 1 TABLET BY MOUTH EVERY DAY AS DIRECTED   gabapentin  (NEURONTIN ) 300 MG capsule ONE TAB BY MOUTH AT BEDTIME FOR A WEEK, THEN TWICE A DAY FOR A WEEK, THEN 3 TIMES A DAY . MAY DOUBLE WEEKLY TO A MAX OF 3,600MG /DAY   Galcanezumab -gnlm (EMGALITY ) 120 MG/ML SOAJ Inject 120  mg into the skin every 30 (thirty) days.   naratriptan  (AMERGE) 2.5 MG tablet Take 1 tablet (2.5 mg total) by mouth 2 (two) times daily as needed.   pantoprazole  (PROTONIX ) 40 MG tablet TAKE 1 TABLET BY MOUTH EVERY DAY   zolpidem  (AMBIEN ) 10 MG tablet TAKE 1 TABLET BY MOUTH EVERY DAY AT NIGHT   No facility-administered encounter medications on file as of 08/19/2024.    Allergies  Allergen Reactions   Amitriptyline Nausea And Vomiting   Augmentin  [Amoxicillin -Pot Clavulanate]     Vomiting/diarrhea     Omnicef [Cefdinir] Other (See Comments)    GI upset   Trazodone  And Nefazodone Nausea And Vomiting    Review of Systems  Constitutional:  Negative for fatigue and fever.  HENT:  Negative for congestion and sore throat.   Eyes:  Negative for visual disturbance.  Respiratory:  Negative for cough.   Cardiovascular:  Negative for chest pain.  Gastrointestinal:  Negative for abdominal pain and nausea.  Genitourinary:  Negative for dysuria.  Musculoskeletal:  Negative for back pain, joint swelling and neck pain.  Skin:  Negative for rash.  Neurological:  Negative for weakness and headaches.  Hematological:  Negative for adenopathy.    PE; Blood pressure 110/70, pulse 77, temperature 98.6 F (37 C), height 5' 4.5 (1.638 m), weight 133 lb 3.2 oz (60.4 kg), last menstrual period 07/30/2012, SpO2 98%. Body mass index is 22.51 kg/m.  Physical Exam  Gen: Alert, well appearing.  Patient is oriented to person, place, time, and situation. AFFECT: pleasant, lucid thought and speech. ZWU:Zbzd: no injection, icteris, swelling, or exudate.  EOMI, PERRLA. Mouth: lips without lesion/swelling.  Oral mucosa pink and moist. Oropharynx without erythema, exudate, or swelling.  Neck - No masses or thyromegaly or limitation in range of motion.  No bruits CV: RRR, no m/r/g.   LUNGS: CTA bilat, nonlabored resps, good aeration in all lung fields. EXT: no clubbing or cyanosis.  no edema.    Pertinent  labs:  Last CBC Lab Results  Component Value Date   WBC 6.3 08/08/2023   HGB 14.1 08/08/2023   HCT 42.2 08/08/2023   MCV 86 08/08/2023   MCH 28.8 08/08/2023   RDW 12.1 08/08/2023   PLT 286 08/08/2023   Last metabolic panel Lab Results  Component Value Date   GLUCOSE 83 08/08/2023   NA 140 08/08/2023   K 4.0 08/08/2023   CL 100 08/08/2023   CO2 22 08/08/2023  BUN 17 08/08/2023   CREATININE 0.88 08/08/2023   EGFR 80 08/08/2023   CALCIUM 9.7 08/08/2023   PROT 7.7 08/08/2023   ALBUMIN 4.7 08/08/2023   LABGLOB 3.0 08/08/2023   BILITOT 0.4 08/08/2023   ALKPHOS 88 08/08/2023   AST 19 08/08/2023   ALT 17 08/08/2023   Last lipids Lab Results  Component Value Date   CHOL 246 (H) 10/31/2022   HDL 101 10/31/2022   LDLCALC 128 (H) 10/31/2022   TRIG 80 10/31/2022   CHOLHDL 2.4 10/31/2022   Last hemoglobin A1c Lab Results  Component Value Date   HGBA1C 5.1 10/31/2022   Last thyroid functions Lab Results  Component Value Date   TSH 0.97 10/31/2022   Last vitamin D  Lab Results  Component Value Date   VD25OH 36 05/05/2014   ASSESSMENT AND PLAN:   New patient, establishing care.  1.  GERD, not well-controlled.   Increase pantoprazole  to 40 mg twice a day until symptoms go back to baseline and she can resume once a day dosing. Dietary modification encouraged.  #2 musculoskeletal pain, recent daily use of NSAID x 6 months or so. Discussed the potential risks of long-term NSAID therapy.  She is aware. When she comes in for a physical at her convenience we will do renal function monitoring. Euflexxa injections left knee and further evaluation of right hip with MR arthrogram--> as per sports med MD, Dr. Cleatrice.  #3 Preventative health care Vaccines: Flu->declined.  Prevnar 20->declined.  Shingrix-->declined. Cervical ca screening: Per GYN MD (Dr. Marget).  Patient has remote history of total abdominal hysterectomy for benign diagnosis. Breast ca screening: per GYN  MD Colon ca screening: February 2024 Cologuard NEG.  Next Cologuard February 2027.  An After Visit Summary was printed and given to the patient.  Return for CPE at patient's convenience.  Signed:  Gerlene Hockey, MD           08/19/2024

## 2024-08-23 ENCOUNTER — Ambulatory Visit
Admission: RE | Admit: 2024-08-23 | Discharge: 2024-08-23 | Disposition: A | Discharge status: Home / Self Care | Source: Ambulatory Visit | Attending: Family Medicine | Admitting: Family Medicine

## 2024-08-23 ENCOUNTER — Ambulatory Visit
Admission: RE | Admit: 2024-08-23 | Discharge: 2024-08-23 | Disposition: A | Source: Ambulatory Visit | Attending: Family Medicine | Admitting: Family Medicine

## 2024-08-23 DIAGNOSIS — M25551 Pain in right hip: Secondary | ICD-10-CM

## 2024-08-23 MED ORDER — IOPAMIDOL (ISOVUE-M 200) INJECTION 41%
10.0000 mL | Freq: Once | INTRAMUSCULAR | Status: AC
  Administered 2024-08-23: 10 mL via INTRA_ARTICULAR

## 2024-08-25 ENCOUNTER — Ambulatory Visit (INDEPENDENT_AMBULATORY_CARE_PROVIDER_SITE_OTHER): Admitting: Family Medicine

## 2024-08-25 ENCOUNTER — Other Ambulatory Visit: Payer: Self-pay

## 2024-08-25 VITALS — Ht 64.0 in | Wt 125.0 lb

## 2024-08-25 DIAGNOSIS — M1712 Unilateral primary osteoarthritis, left knee: Secondary | ICD-10-CM

## 2024-08-25 DIAGNOSIS — M24859 Other specific joint derangements of unspecified hip, not elsewhere classified: Secondary | ICD-10-CM

## 2024-08-25 MED ORDER — SODIUM HYALURONATE (VISCOSUP) 20 MG/2ML IX SOSY
20.0000 mg | PREFILLED_SYRINGE | Freq: Once | INTRA_ARTICULAR | Status: AC
  Administered 2024-08-25: 20 mg via INTRA_ARTICULAR

## 2024-08-25 NOTE — Progress Notes (Signed)
 PCP: Candise Aleene DEL, MD  Patient is a 53 y.o. female here for left knee visco.  HPI Patient returns to start viscosupplementation of left knee for osteoarthritis.  Also reviewed her MRI arthrogram of the right hip.  Past Medical History:  Diagnosis Date   Asthma    intermittent   Bilateral hip pain    R labral tear MRI 2021.  L MRI essentially normal 01/2024.   Borderline hyperlipidemia    No med trials in the past   Cervical spondylosis    Chronic pain of left knee    osteoarthritis, hx of meniscus tear and surg debridement   Colon cancer screening    11/2022 cologuard NEG   GERD (gastroesophageal reflux disease)    controlled with diet   Insomnia    Left lumbar radiculopathy    [facility]I has helped.  Dr Apolinar surgical lesion   Lumbar facet arthropathy    Migraine syndrome    Neurologist   Seasonal allergies     Current Outpatient Medications on File Prior to Visit  Medication Sig Dispense Refill   celecoxib  (CELEBREX ) 200 MG capsule NEEDS NEW PCP. One to 2 tablets by mouth daily as needed for pain. 60 capsule 0   estradiol (ESTRACE) 2 MG tablet estradiol 2 mg tablet  TAKE 1 TABLET BY MOUTH EVERY DAY AS DIRECTED     gabapentin  (NEURONTIN ) 300 MG capsule ONE TAB BY MOUTH AT BEDTIME FOR A WEEK, THEN TWICE A DAY FOR A WEEK, THEN 3 TIMES A DAY . MAY DOUBLE WEEKLY TO A MAX OF 3,600MG /DAY 90 capsule 3   Galcanezumab -gnlm (EMGALITY ) 120 MG/ML SOAJ Inject 120 mg into the skin every 30 (thirty) days. 1 mL 11   naratriptan  (AMERGE) 2.5 MG tablet Take 1 tablet (2.5 mg total) by mouth 2 (two) times daily as needed. 10 tablet 11   pantoprazole  (PROTONIX ) 40 MG tablet TAKE 1 TABLET BY MOUTH EVERY DAY 30 tablet 3   zolpidem  (AMBIEN ) 10 MG tablet TAKE 1 TABLET BY MOUTH EVERY DAY AT NIGHT 90 tablet 2   No current facility-administered medications on file prior to visit.    Past Surgical History:  Procedure Laterality Date   ABDOMINAL HYSTERECTOMY  08/31/2012   Procedure:  HYSTERECTOMY ABDOMINAL;  Surgeon: Alm JAYSON Cook, MD;  Location: [facility];  Service: Gynecology;  Laterality: N/A;   CESAREAN SECTION  2005, 2007   x2   DILATION AND CURETTAGE OF UTERUS     missed ab   KNEE ARTHROSCOPY Left    meniscal debridement   MYOMECTOMY     TONSILLECTOMY     WISDOM TOOTH EXTRACTION      Allergies  Allergen Reactions   Amitriptyline Nausea And Vomiting   Augmentin  [Amoxicillin -Pot Clavulanate]     Vomiting/diarrhea     Omnicef [Cefdinir] Other (See Comments)    GI upset   Trazodone  And Nefazodone Nausea And Vomiting    Ht 5' 4 (1.626 m)   Wt 125 lb (56.7 kg)   LMP 07/30/2012   BMI 21.46 kg/m       No data to display              No data to display              Objective:  Physical Exam:  Gen: NAD, comfortable in exam room  Assessment and Plan:  Left knee osteoarthritis - euflexxa series started today.  Follow up in 1 week.  After informed written consent timeout was performed, patient  was lying supine on exam table. Left knee was prepped with alcohol swab and utilizing superolateral approach with ultrasound guidance, patient's left knee was injected intraarticularly with 3mL lidocaine  followed by euflexxa. Patient tolerated the procedure well without immediate complications.  2. Right hip pain - MR arthrogram reassuring with only tendinopathy.  No evidence labral tear.  Consistent with snapping hip (iliopsoas) - will start formal physical therapy.

## 2024-09-01 ENCOUNTER — Ambulatory Visit (INDEPENDENT_AMBULATORY_CARE_PROVIDER_SITE_OTHER): Admitting: Family Medicine

## 2024-09-01 ENCOUNTER — Other Ambulatory Visit: Payer: Self-pay

## 2024-09-01 VITALS — BP 118/76 | Ht 64.0 in | Wt 125.0 lb

## 2024-09-01 DIAGNOSIS — M1712 Unilateral primary osteoarthritis, left knee: Secondary | ICD-10-CM

## 2024-09-01 MED ORDER — SODIUM HYALURONATE (VISCOSUP) 20 MG/2ML IX SOSY
20.0000 mg | PREFILLED_SYRINGE | Freq: Once | INTRA_ARTICULAR | Status: AC
  Administered 2024-09-01: 20 mg via INTRA_ARTICULAR

## 2024-09-01 NOTE — Progress Notes (Signed)
 PCP: Candise Aleene DEL, MD  Patient is a 53 y.o. female here for left knee visco injection.  HPI Patient reports she's doing well without side effects from last injection.  Past Medical History:  Diagnosis Date   Asthma    intermittent   Bilateral hip pain    R labral tear MRI 2021.  L MRI essentially normal 01/2024.   Borderline hyperlipidemia    No med trials in the past   Cervical spondylosis    Chronic pain of left knee    osteoarthritis, hx of meniscus tear and surg debridement   Colon cancer screening    11/2022 cologuard NEG   GERD (gastroesophageal reflux disease)    controlled with diet   Insomnia    Left lumbar radiculopathy    ESI has helped.  Dr Apolinar surgical lesion   Lumbar facet arthropathy    Migraine syndrome    Neurologist   Seasonal allergies     Current Outpatient Medications on File Prior to Visit  Medication Sig Dispense Refill   celecoxib  (CELEBREX ) 200 MG capsule NEEDS NEW PCP. One to 2 tablets by mouth daily as needed for pain. 60 capsule 0   estradiol (ESTRACE) 2 MG tablet estradiol 2 mg tablet  TAKE 1 TABLET BY MOUTH EVERY DAY AS DIRECTED     gabapentin  (NEURONTIN ) 300 MG capsule ONE TAB BY MOUTH AT BEDTIME FOR A WEEK, THEN TWICE A DAY FOR A WEEK, THEN 3 TIMES A DAY . MAY DOUBLE WEEKLY TO A MAX OF 3,600MG /DAY 90 capsule 3   Galcanezumab -gnlm (EMGALITY ) 120 MG/ML SOAJ Inject 120 mg into the skin every 30 (thirty) days. 1 mL 11   naratriptan  (AMERGE) 2.5 MG tablet Take 1 tablet (2.5 mg total) by mouth 2 (two) times daily as needed. 10 tablet 11   pantoprazole  (PROTONIX ) 40 MG tablet TAKE 1 TABLET BY MOUTH EVERY DAY 30 tablet 3   zolpidem  (AMBIEN ) 10 MG tablet TAKE 1 TABLET BY MOUTH EVERY DAY AT NIGHT 90 tablet 2   No current facility-administered medications on file prior to visit.    Past Surgical History:  Procedure Laterality Date   ABDOMINAL HYSTERECTOMY  08/31/2012   Procedure: HYSTERECTOMY ABDOMINAL;  Surgeon: Alm JAYSON Cook, MD;   Location: [facility];  Service: Gynecology;  Laterality: N/A;   CESAREAN SECTION  2005, 2007   x2   DILATION AND CURETTAGE OF UTERUS     missed ab   KNEE ARTHROSCOPY Left    meniscal debridement   MYOMECTOMY     TONSILLECTOMY     WISDOM TOOTH EXTRACTION      Allergies  Allergen Reactions   Amitriptyline Nausea And Vomiting   Augmentin  [Amoxicillin -Pot Clavulanate]     Vomiting/diarrhea     Omnicef [Cefdinir] Other (See Comments)    GI upset   Trazodone  And Nefazodone Nausea And Vomiting    BP 118/76   Ht 5' 4 (1.626 m)   Wt 125 lb (56.7 kg)   LMP 07/30/2012   BMI 21.46 kg/m       No data to display              No data to display              Objective:  Physical Exam:  Gen: NAD, comfortable in exam room  Knee exam not repeated today.  No erythema or warmth however.  Assessment and Plan:  Left knee osteoarthritis - second visco injection given today.  Follow up in 1 week  for third injection.  After informed written consent timeout was performed, patient was lying supine on exam table. Left knee was prepped with alcohol swab and utilizing superolateral approach with ultrasound guidance, patient's left knee was injected intraarticularly with 3mL lidocaine  followed by euflexxa. Patient tolerated the procedure well without immediate complications.

## 2024-09-03 ENCOUNTER — Encounter: Payer: Self-pay | Admitting: Family Medicine

## 2024-09-03 ENCOUNTER — Other Ambulatory Visit: Payer: Self-pay | Admitting: Family Medicine

## 2024-09-03 DIAGNOSIS — M5416 Radiculopathy, lumbar region: Secondary | ICD-10-CM

## 2024-09-06 ENCOUNTER — Ambulatory Visit: Admitting: Family Medicine

## 2024-09-08 ENCOUNTER — Ambulatory Visit: Admitting: Family Medicine

## 2024-09-08 ENCOUNTER — Other Ambulatory Visit: Payer: Self-pay

## 2024-09-08 VITALS — BP 106/72 | Ht 64.0 in | Wt 125.0 lb

## 2024-09-08 DIAGNOSIS — M1712 Unilateral primary osteoarthritis, left knee: Secondary | ICD-10-CM

## 2024-09-08 MED ORDER — SODIUM HYALURONATE (VISCOSUP) 20 MG/2ML IX SOSY
20.0000 mg | PREFILLED_SYRINGE | Freq: Once | INTRA_ARTICULAR | Status: AC
  Administered 2024-09-08: 20 mg via INTRA_ARTICULAR

## 2024-09-08 NOTE — Progress Notes (Signed)
 PCP: Candise Aleene DEL, MD  Patient is a 53 y.o. female here for left knee gel injection.  HPI Patient reports she's feeling some improvement since starting the series. No side effects, other concerns.  Past Medical History:  Diagnosis Date   Asthma    intermittent   Bilateral hip pain    R labral tear MRI 2021.  L MRI essentially normal 01/2024.   Borderline hyperlipidemia    No med trials in the past   Cervical spondylosis    Chronic pain of left knee    osteoarthritis, hx of meniscus tear and surg debridement   Colon cancer screening    11/2022 cologuard NEG   GERD (gastroesophageal reflux disease)    controlled with diet   Insomnia    Left lumbar radiculopathy    ESI has helped.  Dr Apolinar surgical lesion   Lumbar facet arthropathy    Migraine syndrome    Neurologist   Seasonal allergies     Current Outpatient Medications on File Prior to Visit  Medication Sig Dispense Refill   celecoxib  (CELEBREX ) 200 MG capsule NEEDS NEW PCP. One to 2 tablets by mouth daily as needed for pain. 60 capsule 0   estradiol (ESTRACE) 2 MG tablet estradiol 2 mg tablet  TAKE 1 TABLET BY MOUTH EVERY DAY AS DIRECTED     gabapentin  (NEURONTIN ) 300 MG capsule ONE TAB BY MOUTH AT BEDTIME FOR A WEEK, THEN TWICE A DAY FOR A WEEK, THEN 3 TIMES A DAY . MAY DOUBLE WEEKLY TO A MAX OF 3,600MG /DAY 90 capsule 3   Galcanezumab -gnlm (EMGALITY ) 120 MG/ML SOAJ Inject 120 mg into the skin every 30 (thirty) days. 1 mL 11   naratriptan  (AMERGE) 2.5 MG tablet Take 1 tablet (2.5 mg total) by mouth 2 (two) times daily as needed. 10 tablet 11   pantoprazole  (PROTONIX ) 40 MG tablet Take 1 tablet (40 mg total) by mouth 2 (two) times daily. 180 tablet 1   zolpidem  (AMBIEN ) 10 MG tablet TAKE 1 TABLET BY MOUTH EVERY DAY AT NIGHT 90 tablet 2   No current facility-administered medications on file prior to visit.    Past Surgical History:  Procedure Laterality Date   ABDOMINAL HYSTERECTOMY  08/31/2012   Procedure:  HYSTERECTOMY ABDOMINAL;  Surgeon: Alm JAYSON Cook, MD;  Location: [facility];  Service: Gynecology;  Laterality: N/A;   CESAREAN SECTION  2005, 2007   x2   DILATION AND CURETTAGE OF UTERUS     missed ab   KNEE ARTHROSCOPY Left    meniscal debridement   MYOMECTOMY     TONSILLECTOMY     WISDOM TOOTH EXTRACTION      Allergies  Allergen Reactions   Amitriptyline Nausea And Vomiting   Augmentin  [Amoxicillin -Pot Clavulanate]     Vomiting/diarrhea     Omnicef [Cefdinir] Other (See Comments)    GI upset   Trazodone  And Nefazodone Nausea And Vomiting    BP 106/72   Ht 5' 4 (1.626 m)   Wt 125 lb (56.7 kg)   LMP 07/30/2012   BMI 21.46 kg/m       No data to display              No data to display              Objective:  Physical Exam:  Gen: NAD, comfortable in exam room  Knee exam not repeated today  Assessment and Plan:  Left knee arthritis - third visco injection given today.  Follow up  as needed.  After informed written consent timeout was performed, patient was lying supine on exam table. Left knee was prepped with alcohol swab and utilizing superolateral approach with ultrasound guidance, patient's left knee was injected intraarticularly with 3mL lidocaine  followed by euflexxa. Patient tolerated the procedure well without immediate complications.

## 2024-09-18 ENCOUNTER — Encounter: Payer: Self-pay | Admitting: Family Medicine

## 2024-09-18 DIAGNOSIS — M5416 Radiculopathy, lumbar region: Secondary | ICD-10-CM

## 2024-09-20 MED ORDER — CELECOXIB 200 MG PO CAPS: ORAL_CAPSULE | ORAL | 5 refills | Status: AC

## 2024-09-20 NOTE — Telephone Encounter (Signed)
Celebrex prescription sent

## 2024-09-21 ENCOUNTER — Other Ambulatory Visit: Payer: Self-pay | Admitting: Neurology

## 2024-09-21 ENCOUNTER — Other Ambulatory Visit: Payer: Self-pay | Admitting: Family Medicine

## 2024-09-21 DIAGNOSIS — M5030 Other cervical disc degeneration, unspecified cervical region: Secondary | ICD-10-CM

## 2024-09-21 NOTE — Progress Notes (Unsigned)
 Patient: Ana [name] Date of Birth: [birth year]  Reason for Visit: Follow up History from: Patient Primary Neurologist: [facility]  Virtual Visit via Video Note  I connected with Ana [name] on 09/21/24 at  3:30 PM EST by a video enabled telemedicine application and verified that I am speaking with the correct person using two identifiers.  Location: Patient: *** Provider: ***   I discussed the limitations of evaluation and management by telemedicine and the availability of in person appointments. The patient expressed understanding and agreed to proceed.  ASSESSMENT AND PLAN 53 y.o. year old female   1.  Chronic migraine headache  -Under very good control -Continue Emgality  for migraine prevention -Continue Topamax  25 mg at bedtime for migraine prevention, may try to wean off this -Continue Amerge 2.5 mg as needed for acute migraine -Has Nurtec sample she may try -Follow-up in 1 year virtually or sooner if needed  HISTORY OF PRESENT ILLNESS: Today 09/21/24 09/22/24 SS: 53   09/16/23 SS: Right now doing great. Migraines worsen in spring/summer. In the last month, no more than 2 migraines. Remains on Emgality . Takes Amerge PRN. Migraines are usually several days back to back. May start with motrin . Amerge works well, was given samples Nurtec, hasn't tried it yet. Remains on Topamax  25 mg at bedtime. Had stopped it, restarted it during summer months. 1-2 times a year will need a prednisone  taper. Having some back issues.   HISTORY  01/22/23 Dr. Rush: Brief HPI: 53 year old female who follows in clinic for chronic migraines and right-sided headaches with ipsilateral autonomic features. MRI brain 02/27/22 was normal.    At her last visit she was continued on Emgality  for migraine prevention and naratriptan  for rescue   Interval History: Headaches are about the same since her last visit. She did miss a couple of doses of Emgality  due to insurance and pharmacy supply issues.  Naratriptan  continues to help for rescue, though she will occasionally get migraines that last for 1-2 weeks which do not respond to naratriptan .   Migraine days per month: 5 Headache free days per month: 25   Current Headache Regimen: Preventative: Emgality  120 mg monthly Abortive: naratriptan  2.5 mg PRN   Prior Therapies                                  Prevention: Topamax  50 mg QHS - side effects Gabapentin  - side effects Emgality  120 mg monthly   Rescue: Naratriptan  2.5 mg PRN Maxalt  10 mg PRN  REVIEW OF SYSTEMS: Out of a complete 14 system review of symptoms, the patient complains only of the following symptoms, and all other reviewed systems are negative.  See HPI  ALLERGIES: Allergies  Allergen Reactions   Amitriptyline Nausea And Vomiting   Augmentin  [Amoxicillin -Pot Clavulanate]     Vomiting/diarrhea     Omnicef [Cefdinir] Other (See Comments)    GI upset   Trazodone  And Nefazodone Nausea And Vomiting    HOME MEDICATIONS: Outpatient Medications Prior to Visit  Medication Sig Dispense Refill   celecoxib  (CELEBREX ) 200 MG capsule One to 2 tablets by mouth daily as needed for pain. 60 capsule 5   estradiol (ESTRACE) 2 MG tablet estradiol 2 mg tablet  TAKE 1 TABLET BY MOUTH EVERY DAY AS DIRECTED     gabapentin  (NEURONTIN ) 300 MG capsule 1 cap po tid 90 capsule 3   Galcanezumab -gnlm (EMGALITY ) 120 MG/ML SOAJ INJECT 120 MG INTO  THE SKIN EVERY 30 (THIRTY) DAYS. 1 mL 11   naratriptan  (AMERGE) 2.5 MG tablet Take 1 tablet (2.5 mg total) by mouth 2 (two) times daily as needed. 10 tablet 11   pantoprazole  (PROTONIX ) 40 MG tablet Take 1 tablet (40 mg total) by mouth 2 (two) times daily. 180 tablet 1   zolpidem  (AMBIEN ) 10 MG tablet TAKE 1 TABLET BY MOUTH EVERY DAY AT NIGHT 90 tablet 2   No facility-administered medications prior to visit.    PAST MEDICAL HISTORY: Past Medical History:  Diagnosis Date   Asthma    intermittent   Bilateral hip pain    R labral tear MRI  2021.  L MRI essentially normal 01/2024.   Borderline hyperlipidemia    No med trials in the past   Cervical spondylosis    Chronic pain of left knee    osteoarthritis, hx of meniscus tear and surg debridement   Colon cancer screening    11/2022 cologuard NEG   GERD (gastroesophageal reflux disease)    controlled with diet   Insomnia    Left lumbar radiculopathy    ESI has helped.  Dr Apolinar surgical lesion   Lumbar facet arthropathy    Migraine syndrome    Neurologist   Seasonal allergies     PAST SURGICAL HISTORY: Past Surgical History:  Procedure Laterality Date   ABDOMINAL HYSTERECTOMY  08/31/2012   Procedure: HYSTERECTOMY ABDOMINAL;  Surgeon: Alm JAYSON Cook, MD;  Location: [facility];  Service: Gynecology;  Laterality: N/A;   CESAREAN SECTION  2005, 2007   x2   DILATION AND CURETTAGE OF UTERUS     missed ab   KNEE ARTHROSCOPY Left    meniscal debridement   MYOMECTOMY     TONSILLECTOMY     WISDOM TOOTH EXTRACTION      FAMILY HISTORY: Family History  Problem Relation Age of Onset   Arthritis Mother    Hypertension Mother    Hyperlipidemia Father    Hypertension Father    Arthritis Maternal Grandmother    Breast cancer Paternal Grandmother        breast   Heart disease Paternal Grandfather    Migraines Neg Hx     SOCIAL HISTORY: Social History   Socioeconomic History   Marital status: Married    Spouse name: [employer]   Number of children: 2   Years of education: College   Highest education level: Not on file  Occupational History    Employer: [employer]  Tobacco Use   Smoking status: Never   Smokeless tobacco: Never  Vaping Use   Vaping status: Never Used  Substance and Sexual Activity   Alcohol use: No    Alcohol/week: 0.0 standard drinks of alcohol    Comment: social 1 glass of wine monthly   Drug use: No   Sexual activity: Yes    Partners: Male    Birth control/protection: None    Comment: married  Other Topics Concern   Not on file   Social History Narrative   Married.  Two kids.   College grad--[employer].   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   No T/A/Ds.   Caffeine Use: 1-2 cup daily   Patient is right handed.       Social Drivers of Corporate Investment Banker Strain: Not on file  Food Insecurity: Not on file  Transportation Needs: Not on file  Physical Activity: Not on file  Stress: Not on file  Social Connections: Not on  file  Intimate Partner Violence: Not on file    PHYSICAL EXAM  There were no vitals filed for this visit.  There is no height or weight on file to calculate BMI.  Generalized: Well developed, in no acute distress  Neurological examination  Mentation: Alert oriented to time, place, history taking. Follows all commands speech and language fluent Cranial nerve II-XII: Pupils were equal round reactive to light. Extraocular movements were full, visual field were full on confrontational test. Facial sensation and strength were normal. Head turning and shoulder shrug  were normal and symmetric. Motor: The motor testing reveals 5 over 5 strength of all 4 extremities. Good symmetric motor tone is noted throughout.  Sensory: Sensory testing is intact to soft touch on all 4 extremities. No evidence of extinction is noted.  Coordination: Cerebellar testing reveals good finger-nose-finger and heel-to-shin bilaterally.  Gait and station: Gait is normal.   DIAGNOSTIC DATA (LABS, IMAGING, TESTING) - I reviewed patient records, labs, notes, testing and imaging myself where available.  Lab Results  Component Value Date   WBC 6.3 08/08/2023   HGB 14.1 08/08/2023   HCT 42.2 08/08/2023   MCV 86 08/08/2023   PLT 286 08/08/2023      Component Value Date/Time   NA 140 08/08/2023 1635   K 4.0 08/08/2023 1635   CL 100 08/08/2023 1635   CO2 22 08/08/2023 1635   GLUCOSE 83 08/08/2023 1635   GLUCOSE 81 10/31/2022 1151   BUN 17 08/08/2023 1635   CREATININE 0.88  08/08/2023 1635   CREATININE 0.92 10/31/2022 1151   CALCIUM 9.7 08/08/2023 1635   PROT 7.7 08/08/2023 1635   ALBUMIN 4.7 08/08/2023 1635   AST 19 08/08/2023 1635   ALT 17 08/08/2023 1635   ALKPHOS 88 08/08/2023 1635   BILITOT 0.4 08/08/2023 1635   GFRNONAA 74 01/10/2021 0000   GFRAA 86 01/10/2021 0000   Lab Results  Component Value Date   CHOL 246 (H) 10/31/2022   HDL 101 10/31/2022   LDLCALC 128 (H) 10/31/2022   TRIG 80 10/31/2022   CHOLHDL 2.4 10/31/2022   Lab Results  Component Value Date   HGBA1C 5.1 10/31/2022   No results found for: [id number] Lab Results  Component Value Date   TSH 0.97 10/31/2022    Lauraine Born, AGNP-C, DNP 09/21/2024, 4:07 PM [facility] [address] [place], [place] [address] [phone]

## 2024-09-21 NOTE — Telephone Encounter (Signed)
 Refill requested for Gabapentin   Last ov 11/6 Next ov not yet scheduled

## 2024-09-22 ENCOUNTER — Other Ambulatory Visit: Payer: Self-pay | Admitting: Neurology

## 2024-09-22 ENCOUNTER — Telehealth: Payer: BC Managed Care – PPO | Admitting: Neurology

## 2024-09-22 DIAGNOSIS — G43709 Chronic migraine without aura, not intractable, without status migrainosus: Secondary | ICD-10-CM

## 2024-09-22 DIAGNOSIS — G43009 Migraine without aura, not intractable, without status migrainosus: Secondary | ICD-10-CM

## 2024-09-22 DIAGNOSIS — M5030 Other cervical disc degeneration, unspecified cervical region: Secondary | ICD-10-CM

## 2024-09-22 MED ORDER — EMGALITY 120 MG/ML ~~LOC~~ SOAJ: 120.0000 mg | SUBCUTANEOUS | 11 refills | Status: AC

## 2024-09-22 MED ORDER — NARATRIPTAN HCL 2.5 MG PO TABS: 2.5000 mg | ORAL_TABLET | Freq: Two times a day (BID) | ORAL | 11 refills | Status: AC | PRN

## 2024-09-22 MED ORDER — UBRELVY 100 MG PO TABS: 100.0000 mg | ORAL_TABLET | ORAL | 11 refills | Status: DC | PRN

## 2024-09-22 NOTE — Patient Instructions (Signed)
 Great to see you today! Continue Emgality  for migraine prevention Try Ubrelvy 100 mg as needed for acute migraine, may repeat in 2 hours if needed Let me know how it works for you Call for worsening headaches.  Follow-up in 1 year.  Thanks!!

## 2024-09-29 ENCOUNTER — Encounter: Admitting: Family Medicine

## 2024-09-29 ENCOUNTER — Telehealth: Payer: Self-pay

## 2024-09-29 ENCOUNTER — Other Ambulatory Visit (HOSPITAL_COMMUNITY): Payer: Self-pay

## 2024-09-29 ENCOUNTER — Telehealth: Payer: Self-pay | Admitting: *Deleted

## 2024-09-29 NOTE — Telephone Encounter (Signed)
 Pharmacy Patient Advocate Encounter   Received notification from Physician's Office that prior authorization for Ubrelvy  is required/requested.   Insurance verification completed.   The patient is insured through [facility].   Per test claim: PA required; PA submitted to above mentioned insurance via Latent Key/confirmation #/EOC [facility] Status is pending

## 2024-09-29 NOTE — Telephone Encounter (Signed)
 Pharmacy Patient Advocate Encounter  Received notification from [employer] that Prior Authorization for Ubrelvy  has been APPROVED from 09/29/2024 to 09/29/2025. Ran test claim, Copay is $0. This test claim was processed through [facility]- copay amounts may vary at other pharmacies due to pharmacy/plan contracts, or as the patient moves through the different stages of their insurance plan.   PA #/Case ID/Reference #: [id number]

## 2024-09-29 NOTE — Telephone Encounter (Signed)
 Sent message to PA team:

## 2024-09-29 NOTE — Telephone Encounter (Signed)
   Maybe PA can be done.

## 2024-10-05 ENCOUNTER — Other Ambulatory Visit (HOSPITAL_COMMUNITY): Payer: Self-pay

## 2024-10-13 ENCOUNTER — Other Ambulatory Visit (HOSPITAL_COMMUNITY): Payer: Self-pay

## 2024-10-13 NOTE — Telephone Encounter (Signed)
 I am getting a refill too soon rejection-saying was filled on 09/30/2024-I will reach out to their pharmacy to clarify.

## 2024-10-13 NOTE — Telephone Encounter (Signed)
 I called [facility] and verified pt did pick up on 09/30/2024-nothing further needed.

## 2024-10-19 ENCOUNTER — Other Ambulatory Visit (HOSPITAL_COMMUNITY): Payer: Self-pay

## 2024-10-19 NOTE — Telephone Encounter (Signed)
 I keeo getting message form [facility] in RX Requests stating PA is needed-Per test claim no PA is needed at this time.   Pharmacy Patient Advocate Encounter   Received notification from RX Request Messages that prior authorization for Ubrelvy  is required/requested.   Insurance verification completed.   The patient is insured through [facility].   Per test claim: Refill too soon. PA is not needed at this time. Medication was filled 09/30/2024. Next eligible fill date is 10/23/2024.

## 2024-11-10 ENCOUNTER — Other Ambulatory Visit: Payer: Self-pay

## 2024-11-10 ENCOUNTER — Ambulatory Visit (INDEPENDENT_AMBULATORY_CARE_PROVIDER_SITE_OTHER): Admitting: Family Medicine

## 2024-11-10 VITALS — BP 120/54 | Ht 64.0 in | Wt 130.0 lb

## 2024-11-10 DIAGNOSIS — M25512 Pain in left shoulder: Secondary | ICD-10-CM

## 2024-11-10 NOTE — Patient Instructions (Addendum)
 You have rotator cuff impingement/bursitis Try to avoid painful activities (overhead activities, lifting with extended arm) as much as possible. Celebrex  with food for pain and inflammation as needed. Can take tylenol  in addition to this. Subacromial injection may be beneficial to help with pain and to decrease inflammation - you were given this today. Start physical therapy with transition to home exercise program. Do home exercise program with theraband and scapular stabilization exercises daily 3 sets of 10 once a day. If not improving at follow-up we will consider further imaging, injection, physical therapy, and/or nitro patches. Follow up with me in 6 weeks.  [facility] in [facility] Address: [address] [place], [place] [address] Phone: [phone]

## 2024-11-11 ENCOUNTER — Encounter: Payer: Self-pay | Admitting: Family Medicine

## 2024-11-11 NOTE — Progress Notes (Signed)
 PCP: Candise Aleene DEL, MD  Patient is a 54 y.o. female here for left shoulder pain.  HPI Patient noted back in November she had some left shoulder pain but focus was more on her knee at the time and recovery from hip issue. However her 4 months of pain has plateaued and not improving, feels like it's worsening. + night pain. Pain is constant and tight anterior left shoulder. Worse with extension and pushups. Has tried celebrex , physical therapy.  Past Medical History:  Diagnosis Date   Asthma    intermittent   Bilateral hip pain    R labral tear MRI 2021.  L MRI essentially normal 01/2024.   Borderline hyperlipidemia    No med trials in the past   Cervical spondylosis    Chronic pain of left knee    osteoarthritis, hx of meniscus tear and surg debridement   Colon cancer screening    11/2022 cologuard NEG   GERD (gastroesophageal reflux disease)    controlled with diet   Insomnia    Left lumbar radiculopathy    ESI has helped.  Dr Apolinar surgical lesion   Lumbar facet arthropathy    Migraine syndrome    Neurologist   Seasonal allergies     Medications Ordered Prior to Encounter[1]  Past Surgical History:  Procedure Laterality Date   ABDOMINAL HYSTERECTOMY  08/31/2012   Procedure: HYSTERECTOMY ABDOMINAL;  Surgeon: Alm JAYSON Cook, MD;  Location: [facility];  Service: Gynecology;  Laterality: N/A;   CESAREAN SECTION  2005, 2007   x2   DILATION AND CURETTAGE OF UTERUS     missed ab   KNEE ARTHROSCOPY Left    meniscal debridement   MYOMECTOMY     TONSILLECTOMY     WISDOM TOOTH EXTRACTION      Allergies[2]  BP (!) 120/54   Ht 5' 4 (1.626 m)   Wt 130 lb (59 kg)   LMP 07/30/2012   BMI 22.31 kg/m       No data to display              No data to display              Objective:  Physical Exam:  Gen: NAD, comfortable in exam room  Left shoulder: No swelling, ecchymoses.  No gross deformity. No TTP AC, mild tenderness proximal biceps  tendon. FROM passively - positive painful arc. Negative Hawkins, positive Neers. Positive Yergasons, positive speeds Strength 5/5 with empty can and resisted internal/external rotation.  Pain empty can. Negative apprehension. NV intact distally.  Complete MSK u/s left shoulder: Biceps tendon: intact without tenosynovitis or tears Pec major tendon: intact Subscapularis: small partial-thickness bursal sided tearing superior aspect of tendon AC joint: minimal arthropathy, mild effusion Infraspinatus: intact without abnormalities Supraspinatus: intact without tears, mild subacromial bursitis Posterior glenohumeral joint: no effusion or paralabral cyst. Posterior labrum appears normal  Impression: mild subacromial bursitis, small age indeterminate partial-thickness bursal sided subscapularis tear  Assessment and Plan:  Left shoulder pain - consistent with rotator cuff impingement and mild bursitis.  Subscapularis tear is likely old.  Celebrex  as needed.  Subacromial injection given today.  Start physical therapy.  Follow up in 6 weeks.  After informed written consent timeout was performed, patient was seated in chair in exam room. Left shoulder was prepped with alcohol swab and utilizing lateral approach with ultrasound guidance, patient's left subacromial space was injected with 3:1 lidocaine : depomedrol. Patient tolerated the procedure well without immediate complications.    [  1]  Current Outpatient Medications on File Prior to Visit  Medication Sig Dispense Refill   celecoxib  (CELEBREX ) 200 MG capsule One to 2 tablets by mouth daily as needed for pain. 60 capsule 5   estradiol (ESTRACE) 2 MG tablet estradiol 2 mg tablet  TAKE 1 TABLET BY MOUTH EVERY DAY AS DIRECTED     gabapentin  (NEURONTIN ) 300 MG capsule 1 cap po tid 90 capsule 3   Galcanezumab -gnlm (EMGALITY ) 120 MG/ML SOAJ Inject 120 mg into the skin every 30 (thirty) days. 1 mL 11   naratriptan  (AMERGE) 2.5 MG tablet Take 1 tablet  (2.5 mg total) by mouth 2 (two) times daily as needed. 10 tablet 11   pantoprazole  (PROTONIX ) 40 MG tablet Take 1 tablet (40 mg total) by mouth 2 (two) times daily. 180 tablet 1   UBRELVY  100 MG TABS TAKE 1 TABLET BY MOUTH AS NEEDED. 12 tablet 11   zolpidem  (AMBIEN ) 10 MG tablet TAKE 1 TABLET BY MOUTH EVERY DAY AT NIGHT 90 tablet 2   No current facility-administered medications on file prior to visit.  [2]  Allergies Allergen Reactions   Amitriptyline Nausea And Vomiting   Augmentin  [Amoxicillin -Pot Clavulanate]     Vomiting/diarrhea     Omnicef [Cefdinir] Other (See Comments)    GI upset   Trazodone  And Nefazodone Nausea And Vomiting

## 2024-12-22 ENCOUNTER — Ambulatory Visit: Admitting: Family Medicine

## 2025-09-28 ENCOUNTER — Telehealth: Admitting: Neurology
